# Patient Record
Sex: Male | Born: 1965 | Race: White | Hispanic: No | Marital: Married | Smoking: Former smoker
Health system: Southern US, Community
[De-identification: ages and names within clinical notes are randomized; demographics above are authoritative.]

## PROBLEM LIST (undated history)

## (undated) DIAGNOSIS — R51 Headache: Secondary | ICD-10-CM

## (undated) DIAGNOSIS — K219 Gastro-esophageal reflux disease without esophagitis: Secondary | ICD-10-CM

## (undated) DIAGNOSIS — E669 Obesity, unspecified: Secondary | ICD-10-CM

## (undated) DIAGNOSIS — I10 Essential (primary) hypertension: Secondary | ICD-10-CM

## (undated) DIAGNOSIS — E785 Hyperlipidemia, unspecified: Secondary | ICD-10-CM

## (undated) DIAGNOSIS — R519 Headache, unspecified: Secondary | ICD-10-CM

## (undated) DIAGNOSIS — G4730 Sleep apnea, unspecified: Secondary | ICD-10-CM

## (undated) HISTORY — DX: Hyperlipidemia, unspecified: E78.5

## (undated) HISTORY — DX: Essential (primary) hypertension: I10

## (undated) HISTORY — DX: Sleep apnea, unspecified: G47.30

## (undated) HISTORY — DX: Obesity, unspecified: E66.9

## (undated) HISTORY — DX: Gastro-esophageal reflux disease without esophagitis: K21.9

---
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
## 2014-01-14 LAB — LIPID PANEL
CHOLESTEROL, TOTAL: 197
HDL: 59 mg/dL (ref 35–70)
LDL (calc): 95
Triglycerides: 216

## 2014-09-05 ENCOUNTER — Ambulatory Visit (INDEPENDENT_AMBULATORY_CARE_PROVIDER_SITE_OTHER): Payer: BC Managed Care – PPO | Admitting: Family Medicine

## 2014-09-05 ENCOUNTER — Encounter: Payer: Self-pay | Admitting: Family Medicine

## 2014-09-05 VITALS — BP 118/82 | HR 84 | Temp 98.2°F | Ht 68.0 in | Wt 214.5 lb

## 2014-09-05 DIAGNOSIS — E785 Hyperlipidemia, unspecified: Secondary | ICD-10-CM

## 2014-09-05 DIAGNOSIS — M25562 Pain in left knee: Secondary | ICD-10-CM

## 2014-09-05 DIAGNOSIS — E66811 Obesity, class 1: Secondary | ICD-10-CM | POA: Insufficient documentation

## 2014-09-05 DIAGNOSIS — Z23 Encounter for immunization: Secondary | ICD-10-CM

## 2014-09-05 DIAGNOSIS — E669 Obesity, unspecified: Secondary | ICD-10-CM

## 2014-09-05 NOTE — Assessment & Plan Note (Signed)
Anticipate meniscal injury vs arthritis. Offered xray, ortho referral.  Pt will monitor for now and update me if sxs persist or fail to improve. Rec continued nsaid, protection with knee sleeve, elevation of leg, and rest. Known R knee injury.

## 2014-09-05 NOTE — Addendum Note (Signed)
Addended by: Josph MachoANCE, Tomie Elko A on: 09/05/2014 05:08 PM   Modules accepted: Orders

## 2014-09-05 NOTE — Assessment & Plan Note (Signed)
Body mass index is 32.62 kg/(m^2).

## 2014-09-05 NOTE — Assessment & Plan Note (Signed)
Will check FLP next fasting labwork and titrate statin accordingly.

## 2014-09-05 NOTE — Patient Instructions (Signed)
I think you either have arthritis or meniscal injury of left knee. Use brace when exercises for both knees, continue aleve as needed, elevate leg. Return at your convenience for fasting labs to check cholesterol. We will decide changes to cholesterol based on readings. Good to see you today, call us with questions. Flu shot today.

## 2014-09-05 NOTE — Progress Notes (Signed)
BP 118/82  Pulse 84  Temp(Src) 98.2 F (36.8 C) (Oral)  Ht 5\' 8"  (1.727 m)  Wt 214 lb 8 oz (97.297 kg)  BMI 32.62 kg/m2   CC: new pt to establish  Subjective:    Patient ID: Johnny Daniel, male    DOB: 02/08/1966, 48 y.o.   MRN: [id number]  HPI: Johnny Daniel is a 48 y.o. male presenting on 09/05/2014 for Establish Care and Knee Pain   Previously saw Dr. Dossie Arbour.  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  Runs 45 min daily regularly, not in last 2 weeks (see below).  HLD - on crestor 40mg  without myalgias. Crestor working well for cholesterol levels. Wonders about decreased muscle mass over last 6 months. Foot cramping improved with turmeric. No muscle weakness.   L knee pain - over last few weeks. Started after one of his routine runs. Stopped running for 2 wks, knee improving but still feels persistent pain at L knee. No known inciting injury/trauma. No locking. + instability. Treating with aleve which helps.  H/o R knee surgery as teen after meniscal injury during football. Chronic R knee pain since then. Told to stop running or would need knee replacement.  Wt Readings from Last 3 Encounters:  09/05/14 214 lb 8 oz (97.297 kg)  Body mass index is 32.62 kg/(m^2).  Preventative: Last CPE with labwork 6 mo ago.  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  Allergies and medications reviewed and updated. No current outpatient prescriptions on file prior to visit.   No current facility-administered medications on file prior to visit.    Review of Systems Per HPI unless specifically indicated above    Objective:    BP 118/82  Pulse 84  Temp(Src) 98.2 F (36.8 C) (Oral)  Ht 5\' 8"   (1.727 m)  Wt 214 lb 8 oz (97.297 kg)  BMI 32.62 kg/m2  Physical Exam  Nursing note and vitals reviewed. Constitutional: He is oriented to person, place, and time. He appears well-developed and well-nourished. No distress.  HENT:  Head: Normocephalic and atraumatic.  Right Ear: Hearing normal.  Left Ear: Hearing normal.  Nose: Nose normal.  Mouth/Throat: Uvula is midline, oropharynx is clear and moist and mucous membranes are normal. No oropharyngeal exudate, posterior oropharyngeal edema or posterior oropharyngeal erythema.  White PNdrainage  Eyes: Conjunctivae and EOM are normal. Pupils are equal, round, and reactive to light. No scleral icterus.  Neck: Normal range of motion. Neck supple.  Cardiovascular: Normal rate, regular rhythm, normal heart sounds and intact distal pulses.   No murmur heard. Pulses:      Radial pulses are 2+ on the right side, and 2+ on the left side.  Pulmonary/Chest: Effort normal and breath sounds normal. No respiratory distress. He has no wheezes. He has no rales.  Musculoskeletal: Normal range of motion. He exhibits no edema.  R knee: marked crepitus with flexion/extension but no pain to palpation. 2 scars present at site of previous surgery L Knee exam: No deformity on inspection. Tender to palpation medial posterior joint line  No effusion/swelling noted. FROM in flex/extension + crepitus. No popliteal fullness. Neg drawer test. Neg mcmurray test. Discomfort with valgus testing No PFgrind. No abnormal patellar mobility.  Lymphadenopathy:  He has no cervical adenopathy.  Neurological: He is alert and oriented to person, place, and time.  CN grossly intact, station and gait intact  Skin: Skin is warm and dry. No rash noted.  Psychiatric: He has a normal mood and affect. His behavior is normal. Judgment and thought content normal.   No results found for this or any previous visit.    Assessment & Plan:   Problem List Items Addressed This  Visit   Obesity     Body mass index is 32.62 kg/(m^2).     Left knee pain - Primary     Anticipate meniscal injury vs arthritis. Offered xray, ortho referral.  Pt will monitor for now and update me if sxs persist or fail to improve. Rec continued nsaid, protection with knee sleeve, elevation of leg, and rest. Known R knee injury.    HLD (hyperlipidemia)     Will check FLP next fasting labwork and titrate statin accordingly.    Relevant Medications      rosuvastatin (CRESTOR) 40 MG tablet   Other Relevant Orders      Lipid panel      Comprehensive metabolic panel       Follow up plan: Return in about 6 months (around 03/07/2015), or as needed, for annual exam, prior fasting for blood work.

## 2014-09-05 NOTE — Progress Notes (Signed)
Pre visit review using our clinic review tool, if applicable. No additional management support is needed unless otherwise documented below in the visit note. 

## 2014-09-11 ENCOUNTER — Other Ambulatory Visit: Payer: BC Managed Care – PPO

## 2014-09-24 ENCOUNTER — Other Ambulatory Visit (INDEPENDENT_AMBULATORY_CARE_PROVIDER_SITE_OTHER): Payer: BC Managed Care – PPO

## 2014-09-24 DIAGNOSIS — E785 Hyperlipidemia, unspecified: Secondary | ICD-10-CM

## 2014-09-24 LAB — COMPREHENSIVE METABOLIC PANEL
ALBUMIN: 3.8 g/dL (ref 3.5–5.2)
ALT: 30 U/L (ref 0–53)
AST: 22 U/L (ref 0–37)
Alkaline Phosphatase: 52 U/L (ref 39–117)
BUN: 15 mg/dL (ref 6–23)
CALCIUM: 9.5 mg/dL (ref 8.4–10.5)
CO2: 22 meq/L (ref 19–32)
Chloride: 102 mEq/L (ref 96–112)
Creatinine, Ser: 0.9 mg/dL (ref 0.4–1.5)
GFR: 91.88 mL/min (ref >60.00)
GLUCOSE: 109 mg/dL — AB (ref 70–99)
POTASSIUM: 4.5 meq/L (ref 3.5–5.1)
SODIUM: 137 meq/L (ref 135–145)
TOTAL PROTEIN: 8 g/dL (ref 6.0–8.3)
Total Bilirubin: 1.1 mg/dL (ref 0.2–1.2)

## 2014-09-24 LAB — LIPID PANEL
CHOLESTEROL: 267 mg/dL — AB (ref 0–200)
HDL: 55.2 mg/dL (ref >39.00)
LDL CALC: 176 mg/dL — AB (ref 0–99)
NonHDL: 211.8
TRIGLYCERIDES: 178 mg/dL — AB (ref 0.0–149.0)
Total CHOL/HDL Ratio: 5
VLDL: 35.6 mg/dL (ref 0.0–40.0)

## 2014-09-30 ENCOUNTER — Other Ambulatory Visit: Payer: Self-pay | Admitting: *Deleted

## 2014-09-30 ENCOUNTER — Encounter: Payer: Self-pay | Admitting: *Deleted

## 2014-09-30 MED ORDER — ROSUVASTATIN CALCIUM 40 MG PO TABS: 40.0000 mg | ORAL_TABLET | Freq: Every day | ORAL | Status: DC

## 2015-03-07 ENCOUNTER — Other Ambulatory Visit (INDEPENDENT_AMBULATORY_CARE_PROVIDER_SITE_OTHER): Payer: BC Managed Care – PPO

## 2015-03-07 ENCOUNTER — Other Ambulatory Visit: Payer: Self-pay | Admitting: Family Medicine

## 2015-03-07 DIAGNOSIS — E785 Hyperlipidemia, unspecified: Secondary | ICD-10-CM

## 2015-03-07 LAB — LIPID PANEL
CHOLESTEROL: 168 mg/dL (ref 0–200)
HDL: 53.1 mg/dL (ref >39.00)
LDL CALC: 100 mg/dL — AB (ref 0–99)
NonHDL: 114.9
Total CHOL/HDL Ratio: 3
Triglycerides: 75 mg/dL (ref 0.0–149.0)
VLDL: 15 mg/dL (ref 0.0–40.0)

## 2015-03-07 LAB — TSH: TSH: 1.95 u[IU]/mL (ref 0.35–4.50)

## 2015-03-07 LAB — COMPREHENSIVE METABOLIC PANEL
ALT: 29 U/L (ref 0–53)
AST: 23 U/L (ref 0–37)
Albumin: 4.1 g/dL (ref 3.5–5.2)
Alkaline Phosphatase: 50 U/L (ref 39–117)
BUN: 18 mg/dL (ref 6–23)
CO2: 28 mEq/L (ref 19–32)
CREATININE: 0.92 mg/dL (ref 0.40–1.50)
Calcium: 9.5 mg/dL (ref 8.4–10.5)
Chloride: 104 mEq/L (ref 96–112)
GFR: 92.86 mL/min (ref >60.00)
Glucose, Bld: 114 mg/dL — ABNORMAL HIGH (ref 70–99)
POTASSIUM: 4.4 meq/L (ref 3.5–5.1)
Sodium: 138 mEq/L (ref 135–145)
Total Bilirubin: 1.1 mg/dL (ref 0.2–1.2)
Total Protein: 7 g/dL (ref 6.0–8.3)

## 2015-03-14 ENCOUNTER — Ambulatory Visit (INDEPENDENT_AMBULATORY_CARE_PROVIDER_SITE_OTHER): Payer: BC Managed Care – PPO | Admitting: Family Medicine

## 2015-03-14 ENCOUNTER — Encounter: Payer: Self-pay | Admitting: Family Medicine

## 2015-03-14 VITALS — BP 108/62 | HR 61 | Temp 98.1°F | Ht 67.25 in | Wt 218.8 lb

## 2015-03-14 DIAGNOSIS — R739 Hyperglycemia, unspecified: Secondary | ICD-10-CM

## 2015-03-14 DIAGNOSIS — E669 Obesity, unspecified: Secondary | ICD-10-CM

## 2015-03-14 DIAGNOSIS — Z0000 Encounter for general adult medical examination without abnormal findings: Secondary | ICD-10-CM | POA: Insufficient documentation

## 2015-03-14 DIAGNOSIS — R7303 Prediabetes: Secondary | ICD-10-CM | POA: Insufficient documentation

## 2015-03-14 DIAGNOSIS — E785 Hyperlipidemia, unspecified: Secondary | ICD-10-CM

## 2015-03-14 DIAGNOSIS — M25562 Pain in left knee: Secondary | ICD-10-CM

## 2015-03-14 DIAGNOSIS — G479 Sleep disorder, unspecified: Secondary | ICD-10-CM

## 2015-03-14 DIAGNOSIS — G4733 Obstructive sleep apnea (adult) (pediatric): Secondary | ICD-10-CM | POA: Insufficient documentation

## 2015-03-14 MED ORDER — LORCASERIN HCL 10 MG PO TABS: 10.0000 mg | ORAL_TABLET | Freq: Two times a day (BID) | ORAL | Status: DC

## 2015-03-14 NOTE — Progress Notes (Signed)
Pre visit review using our clinic review tool, if applicable. No additional management support is needed unless otherwise documented below in the visit note. 

## 2015-03-14 NOTE — Assessment & Plan Note (Signed)
Discussed elevated sugar with patient, encouraged weight loss and staying active and avoiding added sugars.

## 2015-03-14 NOTE — Assessment & Plan Note (Addendum)
Endorses longstanding trouble sleeping, wife endorses snoring and apneic episodes, endorses daytime somnolence, concerned that fit bit shows very small amt restful and deep sleep ESS today = 18-21 Will refer to pulm clinic to consider sleep study.

## 2015-03-14 NOTE — Assessment & Plan Note (Signed)
Discussed healthy diet and lifestyle choices to affect sustainable weight loss. Body mass index is 34.01 kg/(m^2).  Discussed bariatric medications including belviq and phentermine including common side effects and mechanisms of action. Pt would like to trial belviq - 15d Rx and 30d Rx printed for patient as well as coupon box we have available. RTC 2 mo f/u weight.

## 2015-03-14 NOTE — Assessment & Plan Note (Signed)
Encouraged he schedule f/u with ortho as knee still bothering him. 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

## 2015-03-14 NOTE — Progress Notes (Addendum)
BP 108/62 mmHg  Pulse 61  Temp(Src) 98.1 F (36.7 C) (Oral)  Ht 5' 7.25" (1.708 m)  Wt 218 lb 12 oz (99.224 kg)  BMI 34.01 kg/m2  SpO2 94%   CC: CPE  Subjective:    Patient ID: Johnny Daniel, male    DOB: 04/28/1966, 49 y.o.   MRN: [id number]  HPI: Johnny Daniel is a 49 y.o. male presenting on 03/14/2015 for Annual Exam and Knee Pain   See prior note for L knee pain - anticipated meniscal injury vs arthritis, pt declined xray or ortho referral. He actually has seen ortho [facility] and had normal L knee xrays per pt report. Diagnosed with PFPS, advised use band and NSIAD which has helped but pain persists.  Advised return if not improving to discuss MRI. May return to see ortho.   L knee pain - over last several months that comes on at end of run. Started after one of his routine runs. Stopped running for 2 wks, knee improving but still feels persistent pain at L knee. No known inciting injury/trauma. No locking. + instability. Treating with aleve which helps  H/o R knee surgery as teen after meniscal injury during football, also with marked arthritis. Chronic R knee pain since then. Told to stop running or would need knee replacement.  Snoring - nasal saline and mouth guard have helped. Possible apnea reported by wife but improved with above. Vivo fit 2 telling him he only gets 20-30 min deep sleep per night. Noticing more trouble sleeping.  Obesity - interested in medication for this.  Preventative: Flu 08/2014 Td 2010 Seat belt use discussed sunscreen use discussed. Skin care discussed.  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 Interim medical history since our last visit  reviewed. Allergies and medications reviewed and updated. Current Outpatient Prescriptions on File Prior to Visit  Medication Sig  . rosuvastatin (CRESTOR) 40 MG tablet Take 1 tablet (40 mg total) by mouth daily.   No current facility-administered medications on file prior to visit.    Review of Systems  Constitutional: Negative for fever, chills, activity change, appetite change, fatigue and unexpected weight change.  HENT: Negative for hearing loss.   Eyes: Negative for visual disturbance.  Respiratory: Negative for cough, chest tightness, shortness of breath and wheezing.   Cardiovascular: Negative for chest pain, palpitations and leg swelling.  Gastrointestinal: Negative for nausea, vomiting, abdominal pain, diarrhea, constipation, blood in stool and abdominal distention.  Genitourinary: Negative for hematuria and difficulty urinating.  Musculoskeletal: Negative for myalgias, arthralgias and neck pain.  Skin: Negative for rash.  Neurological: Negative for dizziness, seizures, syncope and headaches.  Hematological: Negative for adenopathy. Does not bruise/bleed easily.  Psychiatric/Behavioral: Negative for dysphoric mood. The patient is not nervous/anxious.    Per HPI unless specifically indicated above     Objective:    BP 108/62 mmHg  Pulse 61  Temp(Src) 98.1 F (36.7 C) (Oral)  Ht 5' 7.25" (1.708 m)  Wt 218 lb 12 oz (99.224 kg)  BMI 34.01 kg/m2  SpO2 94%  Wt Readings from Last 3 Encounters:  03/14/15 218 lb 12 oz (99.224 kg)  09/05/14 214 lb 8 oz (97.297 kg)    Physical Exam  Constitutional: He is oriented to person, place,  and time. He appears well-developed and well-nourished. No distress.  HENT:  Head: Normocephalic and atraumatic.  Right Ear: Hearing, tympanic membrane, external ear and ear canal normal.  Left Ear: Hearing, tympanic membrane, external ear and ear canal normal.  Nose: Nose normal.  Mouth/Throat: Uvula is midline, oropharynx is clear and moist  and mucous membranes are normal. No oropharyngeal exudate, posterior oropharyngeal edema or posterior oropharyngeal erythema.  Eyes: Conjunctivae and EOM are normal. Pupils are equal, round, and reactive to light. No scleral icterus.  Neck: Normal range of motion. Neck supple. No thyromegaly present.  Cardiovascular: Normal rate, regular rhythm, normal heart sounds and intact distal pulses.   No murmur heard. Pulses:      Radial pulses are 2+ on the right side, and 2+ on the left side.  Pulmonary/Chest: Effort normal and breath sounds normal. No respiratory distress. He has no wheezes. He has no rales.  Abdominal: Soft. Bowel sounds are normal. He exhibits no distension and no mass. There is no tenderness. There is no rebound and no guarding.  Musculoskeletal: Normal range of motion. He exhibits no edema.  Lymphadenopathy:    He has no cervical adenopathy.  Neurological: He is alert and oriented to person, place, and time.  CN grossly intact, station and gait intact  Skin: Skin is warm and dry. No rash noted.  Psychiatric: He has a normal mood and affect. His behavior is normal. Judgment and thought content normal.  Nursing note and vitals reviewed.  Results for orders placed or performed in visit on 03/07/15  TSH  Result Value Ref Range   TSH 1.95 0.35 - 4.50 uIU/mL  Lipid panel  Result Value Ref Range   Cholesterol 168 0 - 200 mg/dL   Triglycerides 40.9 0.0 - 149.0 mg/dL   HDL 81.19 >14.78 mg/dL   VLDL 29.5 0.0 - 62.1 mg/dL   LDL Cholesterol 308 (H) 0 - 99 mg/dL   Total CHOL/HDL Ratio 3    NonHDL 114.90   Comprehensive metabolic panel  Result Value Ref Range   Sodium 138 135 - 145 mEq/L   Potassium 4.4 3.5 - 5.1 mEq/L   Chloride 104 96 - 112 mEq/L   CO2 28 19 - 32 mEq/L   Glucose, Bld 114 (H) 70 - 99 mg/dL   BUN 18 6 - 23 mg/dL   Creatinine, Ser 6.57 0.40 - 1.50 mg/dL   Total Bilirubin 1.1 0.2 - 1.2 mg/dL   Alkaline Phosphatase 50 39 - 117 U/L   AST 23 0 - 37 U/L   ALT 29  0 - 53 U/L   Total Protein 7.0 6.0 - 8.3 g/dL   Albumin 4.1 3.5 - 5.2 g/dL   Calcium 9.5 8.4 - 84.6 mg/dL   GFR 96.29 >52.84 mL/min      Assessment & Plan:   Problem List Items Addressed This Visit    Sleeping difficulty    Endorses longstanding trouble sleeping, wife endorses snoring and apneic episodes, endorses daytime somnolence, concerned that fit bit shows very small amt restful and deep sleep ESS today = 18-21 Will refer to pulm clinic to consider sleep study.      Relevant Orders   Ambulatory referral to Pulmonology   Obesity    Discussed healthy diet and lifestyle choices to affect sustainable weight loss. Body mass index is 34.01 kg/(m^2).  Discussed bariatric medications including belviq and phentermine including common side effects and mechanisms of action. Pt would like to trial belviq - 15d Rx and 30d  Rx printed for patient as well as coupon box we have available. RTC 2 mo f/u weight.      Relevant Medications   Lorcaserin HCl (BELVIQ) 10 MG TABS   Lorcaserin HCl (BELVIQ) 10 MG TABS   Other Relevant Orders   Ambulatory referral to Pulmonology   Left knee pain    Encouraged he schedule f/u with ortho as knee still bothering him. 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      Hyperglycemia    Discussed elevated sugar with patient, encouraged weight loss and staying active and avoiding added sugars.      HLD (hyperlipidemia)    Chronic, great control on current regimen. No changes indicated. fmhx CAD.      Relevant Medications   aspirin 81 MG EC tablet   Health maintenance examination - Primary    Preventative protocols reviewed and updated unless pt declined. Discussed healthy diet and lifestyle.           Follow up plan: Return in about 2 months (around 05/14/2015), or as needed, for follow up visit.

## 2015-03-14 NOTE — Patient Instructions (Addendum)
Check back in with ortho about left knee. Watch added sugars. Trial belviq - coupon provided today.  We will refer you to sleep doctor for evaluation.

## 2015-03-14 NOTE — Assessment & Plan Note (Signed)
Chronic, great control on current regimen. No changes indicated. fmhx CAD.

## 2015-03-14 NOTE — Assessment & Plan Note (Signed)
Preventative protocols reviewed and updated unless pt declined. Discussed healthy diet and lifestyle.  

## 2015-03-20 ENCOUNTER — Telehealth: Payer: Self-pay | Admitting: *Deleted

## 2015-03-20 NOTE — Telephone Encounter (Signed)
PA for Belviq in your IN box for completion.

## 2015-03-20 NOTE — Telephone Encounter (Signed)
signed and in Kim's box. 

## 2015-03-20 NOTE — Telephone Encounter (Signed)
PA faxed. Will await determination. 

## 2015-03-26 ENCOUNTER — Other Ambulatory Visit: Payer: Self-pay | Admitting: *Deleted

## 2015-03-26 ENCOUNTER — Telehealth: Payer: Self-pay | Admitting: Family Medicine

## 2015-03-26 MED ORDER — ROSUVASTATIN CALCIUM 40 MG PO TABS: 40.0000 mg | ORAL_TABLET | Freq: Every day | ORAL | Status: DC

## 2015-03-26 NOTE — Telephone Encounter (Signed)
-----   Message from [place] Kol[place]rat sent at 03/26/2015  1:01 PM EDT ----- Regarding: Needs New RX for Crestor Dr Reece AgarG, Patient needs New RX for Crestor sent to his pharmacy at [facility] [address] [place]. He will need a New one for a year there are no refills left. Thanks people! Shirlee LimerickMarion

## 2015-03-27 NOTE — Telephone Encounter (Signed)
Additional info required. In your IN box for completion. 

## 2015-03-27 NOTE — Telephone Encounter (Signed)
Filled and in Kim's box. May need to try phentermine first.

## 2015-03-28 NOTE — Telephone Encounter (Signed)
Form faxed

## 2015-04-09 ENCOUNTER — Other Ambulatory Visit: Payer: Self-pay | Admitting: Specialist

## 2015-04-09 DIAGNOSIS — S83232D Complex tear of medial meniscus, current injury, left knee, subsequent encounter: Secondary | ICD-10-CM

## 2015-04-16 ENCOUNTER — Ambulatory Visit
Admission: RE | Admit: 2015-04-16 | Discharge: 2015-04-16 | Disposition: A | Discharge status: Home / Self Care | Payer: BC Managed Care – PPO | Source: Ambulatory Visit | Attending: Specialist | Admitting: Specialist

## 2015-04-16 DIAGNOSIS — S83232D Complex tear of medial meniscus, current injury, left knee, subsequent encounter: Secondary | ICD-10-CM

## 2015-04-16 DIAGNOSIS — M7122 Synovial cyst of popliteal space [Baker], left knee: Secondary | ICD-10-CM | POA: Insufficient documentation

## 2015-04-16 DIAGNOSIS — X58XXXA Exposure to other specified factors, initial encounter: Secondary | ICD-10-CM | POA: Diagnosis not present

## 2015-04-16 DIAGNOSIS — S83232A Complex tear of medial meniscus, current injury, left knee, initial encounter: Secondary | ICD-10-CM | POA: Diagnosis present

## 2015-05-12 MED ORDER — CEFAZOLIN SODIUM 1-5 GM-% IV SOLN
INTRAVENOUS | Status: AC
  Filled 2015-05-12: qty 50

## 2015-05-13 ENCOUNTER — Encounter: Payer: Self-pay | Admitting: *Deleted

## 2015-05-13 ENCOUNTER — Other Ambulatory Visit: Payer: BC Managed Care – PPO

## 2015-05-13 NOTE — Patient Instructions (Signed)
  Your procedure is scheduled on:05-20-15 Report to [facility] 2ND FLOOR To find out your arrival time please call [phone] between 1PM - 3PM on 05-19-15   Remember: Instructions that are not followed completely may result in serious medical risk, up to and including death, or upon the discretion of your surgeon and anesthesiologist your surgery may need to be rescheduled.    _X___ 1. Do not eat food or drink liquids after midnight. No gum chewing or hard candies.     _X___ 2. No Alcohol for 24 hours before or after surgery.   ____ 3. Bring all medications with you on the day of surgery if instructed.    ____ 4. Notify your doctor if there is any change in your medical condition     (cold, fever, infections).     Do not wear jewelry, make-up, hairpins, clips or nail polish.  Do not wear lotions, powders, or perfumes. You may wear deodorant.  Do not shave 48 hours prior to surgery. Men may shave face and neck.  Do not bring valuables to the hospital.    [facility] is not responsible for any belongings or valuables.               Contacts, dentures or bridgework may not be worn into surgery.  Leave your suitcase in the car. After surgery it may be brought to your room.  For patients admitted to the hospital, discharge time is determined by your treatment team.   Patients discharged the day of surgery will not be allowed to drive home.   Please read over the following fact sheets that you were given:      ____ Take these medicines the morning of surgery with A SIP OF WATER:    1. NONE  2.   3.   4.  5.  6.  ____ Fleet Enema (as directed)   ____ Use CHG Soap as directed  ____ Use inhalers on the day of surgery  ____ Stop metformin 2 days prior to surgery    ____ Take 1/2 of usual insulin dose the night before surgery and none on the morning of surgery.   _X___ Stop Coumadin/Plavix/aspirin-STOP ASPIRIN NOW  ____ Stop Anti-inflammatories-NO NSAIDS  OR ASA PRODUCTS-TYLENOL OK   _X___ Stop supplements until after surgery-STOP MELATONIN AND TURMERIC NOW   ____ Bring C-Pap to the hospital.

## 2015-05-14 ENCOUNTER — Encounter: Payer: Self-pay | Admitting: Family Medicine

## 2015-05-14 ENCOUNTER — Ambulatory Visit (INDEPENDENT_AMBULATORY_CARE_PROVIDER_SITE_OTHER): Payer: BC Managed Care – PPO | Admitting: Family Medicine

## 2015-05-14 VITALS — BP 124/82 | HR 60 | Temp 98.1°F | Wt 222.0 lb

## 2015-05-14 DIAGNOSIS — M25562 Pain in left knee: Secondary | ICD-10-CM

## 2015-05-14 DIAGNOSIS — G479 Sleep disorder, unspecified: Secondary | ICD-10-CM

## 2015-05-14 DIAGNOSIS — E669 Obesity, unspecified: Secondary | ICD-10-CM

## 2015-05-14 DIAGNOSIS — E66811 Obesity, class 1: Secondary | ICD-10-CM

## 2015-05-14 MED ORDER — PHENTERMINE HCL 30 MG PO CAPS: 30.0000 mg | ORAL_CAPSULE | ORAL | Status: DC

## 2015-05-14 NOTE — Patient Instructions (Addendum)
Hold on new medicines until after surgery. When you feel up to it start phentermine 30mg  daily for appetite suppression. Stop med immediately if any chest pain.  Return in 27mo for f/u visit.

## 2015-05-14 NOTE — Assessment & Plan Note (Addendum)
Pending arthroscopy next week for complex meniscal tear left knee.

## 2015-05-14 NOTE — Progress Notes (Signed)
Pre visit review using our clinic review tool, if applicable. No additional management support is needed unless otherwise documented below in the visit note. 

## 2015-05-14 NOTE — Assessment & Plan Note (Signed)
Has pending appt with pulm but may need to reschedule 2/2 upcoming knee surgery.

## 2015-05-14 NOTE — Assessment & Plan Note (Signed)
Exercise limited by knee pain pending arthroscopy. belviq not covered by insurance. Will trial phentermine. Discussed mechanism of action and common side effects to monitor. No personal hx CAD. Does have some fmhx CAD - advised if any chest pain to stop immediately and let me know. RTC 2-3 mo f/u visit.

## 2015-05-14 NOTE — Progress Notes (Signed)
BP 124/82 mmHg  Pulse 60  Temp(Src) 98.1 F (36.7 C) (Oral)  Wt 222 lb (100.699 kg)   CC: 2 mo f/u visit obesity Subjective:    Patient ID: Johnny Daniel, male    DOB: 09/03/1966, 50 y.o.   MRN: [id number]  HPI: Johnny Daniel is a 49 y.o. male presenting on 05/14/2015 for Follow-up  Pending L knee arthroscopy next week. Meniscal tear.   Last visit we referred patient to sleep doctor to eval for sleep apnea. Has not seen pulm yet. Appt already scheduled.   Obesity - last visit we started belviq. This was not covered by insurance. Needed to try phentermine first. Unfortunately he has had 4 lb weight gain in last few months.   Pt does have fmhx CAD - father with MI age 50s, heavy smoker and morbid obesity.  Relevant past medical, surgical, family and social history reviewed and updated as indicated. Interim medical history since our last visit reviewed. Allergies and medications reviewed and updated. Current Outpatient Prescriptions on File Prior to Visit  Medication Sig  . aspirin 81 MG EC tablet Take 81 mg by mouth daily. Swallow whole.  . Glucosamine 500 MG CAPS Take 1 capsule by mouth daily.  . Melatonin 5 MG TABS Take 1 tablet by mouth at bedtime.  . rosuvastatin (CRESTOR) 40 MG tablet Take 1 tablet (40 mg total) by mouth daily.  . Turmeric 500 MG CAPS Take 1 capsule by mouth daily.   No current facility-administered medications on file prior to visit.    Review of Systems Per HPI unless specifically indicated above     Objective:    BP 124/82 mmHg  Pulse 60  Temp(Src) 98.1 F (36.7 C) (Oral)  Wt 222 lb (100.699 kg)  Wt Readings from Last 3 Encounters:  05/14/15 222 lb (100.699 kg)  05/13/15 215 lb (97.523 kg)  03/14/15 218 lb 12 oz (99.224 kg)   Body mass index is 33.76 kg/(m^2). Physical Exam  Constitutional: He appears well-developed and well-nourished. No distress.  HENT:  Mouth/Throat: Oropharynx is clear and moist. No oropharyngeal exudate.    Cardiovascular: Normal rate, regular rhythm, normal heart sounds and intact distal pulses.   No murmur heard. Pulmonary/Chest: Effort normal and breath sounds normal. No respiratory distress. He has no wheezes. He has no rales.  Musculoskeletal: He exhibits no edema.  Skin: Skin is warm and dry. No rash noted.  Nursing note and vitals reviewed.  Results for orders placed or performed in visit on 03/07/15  TSH  Result Value Ref Range   TSH 1.95 0.35 - 4.50 uIU/mL  Lipid panel  Result Value Ref Range   Cholesterol 168 0 - 200 mg/dL   Triglycerides 19.1 0.0 - 149.0 mg/dL   HDL 47.82 >95.62 mg/dL   VLDL 13.0 0.0 - 86.5 mg/dL   LDL Cholesterol 784 (H) 0 - 99 mg/dL   Total CHOL/HDL Ratio 3    NonHDL 114.90   Comprehensive metabolic panel  Result Value Ref Range   Sodium 138 135 - 145 mEq/L   Potassium 4.4 3.5 - 5.1 mEq/L   Chloride 104 96 - 112 mEq/L   CO2 28 19 - 32 mEq/L   Glucose, Bld 114 (H) 70 - 99 mg/dL   BUN 18 6 - 23 mg/dL   Creatinine, Ser 6.96 0.40 - 1.50 mg/dL   Total Bilirubin 1.1 0.2 - 1.2 mg/dL   Alkaline Phosphatase 50 39 - 117 U/L   AST 23 0 - 37 U/L  ALT 29 0 - 53 U/L   Total Protein 7.0 6.0 - 8.3 g/dL   Albumin 4.1 3.5 - 5.2 g/dL   Calcium 9.5 8.4 - 14.4 mg/dL   GFR 81.85 >63.14 mL/min      Assessment & Plan:   Problem List Items Addressed This Visit    Left knee pain    Pending arthroscopy next week for complex meniscal tear left knee.      Obesity, Class I, BMI 30-34.9 - Primary    Exercise limited by knee pain pending arthroscopy. belviq not covered by insurance. Will trial phentermine. Discussed mechanism of action and common side effects to monitor. No personal hx CAD. Does have some fmhx CAD - advised if any chest pain to stop immediately and let me know. RTC 2-3 mo f/u visit.      Relevant Medications   phentermine 30 MG capsule   Sleeping difficulty    Has pending appt with pulm but may need to reschedule 2/2 upcoming knee surgery.           Follow up plan: Return in about 3 months (around 08/14/2015), or as needed, for follow up visit.

## 2015-05-20 ENCOUNTER — Ambulatory Visit: Payer: BC Managed Care – PPO | Admitting: Anesthesiology

## 2015-05-20 ENCOUNTER — Encounter
Admission: RE | Disposition: A | Discharge status: Home / Self Care | Payer: Self-pay | Source: Ambulatory Visit | Attending: Specialist

## 2015-05-20 ENCOUNTER — Ambulatory Visit
Admission: RE | Admit: 2015-05-20 | Discharge: 2015-05-20 | Disposition: A | Discharge status: Home / Self Care | Payer: BC Managed Care – PPO | Source: Ambulatory Visit | Attending: Specialist | Admitting: Specialist

## 2015-05-20 ENCOUNTER — Encounter: Payer: Self-pay | Admitting: *Deleted

## 2015-05-20 DIAGNOSIS — G4730 Sleep apnea, unspecified: Secondary | ICD-10-CM | POA: Insufficient documentation

## 2015-05-20 DIAGNOSIS — M25562 Pain in left knee: Secondary | ICD-10-CM | POA: Diagnosis present

## 2015-05-20 DIAGNOSIS — Z823 Family history of stroke: Secondary | ICD-10-CM | POA: Insufficient documentation

## 2015-05-20 DIAGNOSIS — Z791 Long term (current) use of non-steroidal anti-inflammatories (NSAID): Secondary | ICD-10-CM | POA: Diagnosis not present

## 2015-05-20 DIAGNOSIS — I739 Peripheral vascular disease, unspecified: Secondary | ICD-10-CM | POA: Diagnosis not present

## 2015-05-20 DIAGNOSIS — Z79899 Other long term (current) drug therapy: Secondary | ICD-10-CM | POA: Insufficient documentation

## 2015-05-20 DIAGNOSIS — Z87891 Personal history of nicotine dependence: Secondary | ICD-10-CM | POA: Diagnosis not present

## 2015-05-20 DIAGNOSIS — Z8261 Family history of arthritis: Secondary | ICD-10-CM | POA: Insufficient documentation

## 2015-05-20 DIAGNOSIS — Z809 Family history of malignant neoplasm, unspecified: Secondary | ICD-10-CM | POA: Diagnosis not present

## 2015-05-20 DIAGNOSIS — Z9889 Other specified postprocedural states: Secondary | ICD-10-CM | POA: Diagnosis not present

## 2015-05-20 DIAGNOSIS — E780 Pure hypercholesterolemia: Secondary | ICD-10-CM | POA: Insufficient documentation

## 2015-05-20 DIAGNOSIS — S83242A Other tear of medial meniscus, current injury, left knee, initial encounter: Secondary | ICD-10-CM | POA: Diagnosis not present

## 2015-05-20 DIAGNOSIS — X58XXXA Exposure to other specified factors, initial encounter: Secondary | ICD-10-CM | POA: Diagnosis not present

## 2015-05-20 HISTORY — DX: Headache, unspecified: R51.9

## 2015-05-20 HISTORY — PX: KNEE ARTHROSCOPY WITH MEDIAL MENISECTOMY: SHX5651

## 2015-05-20 HISTORY — DX: Headache: R51

## 2015-05-20 SURGERY — ARTHROSCOPY, KNEE, WITH MEDIAL MENISCECTOMY
Anesthesia: General | Laterality: Left

## 2015-05-20 MED ORDER — FAMOTIDINE 20 MG PO TABS
ORAL_TABLET | ORAL | Status: AC
  Filled 2015-05-20: qty 1

## 2015-05-20 MED ORDER — FENTANYL CITRATE (PF) 100 MCG/2ML IJ SOLN
INTRAMUSCULAR | Status: AC
  Administered 2015-05-20: 25 ug via INTRAVENOUS
  Filled 2015-05-20: qty 2

## 2015-05-20 MED ORDER — BUPIVACAINE-EPINEPHRINE (PF) 0.5% -1:200000 IJ SOLN
INTRAMUSCULAR | Status: DC | PRN
  Administered 2015-05-20: 20 mL
  Administered 2015-05-20: 10 mL

## 2015-05-20 MED ORDER — LACTATED RINGERS IV SOLN
Freq: Once | INTRAVENOUS | Status: AC
  Administered 2015-05-20: 07:00:00 via INTRAVENOUS

## 2015-05-20 MED ORDER — FENTANYL CITRATE (PF) 100 MCG/2ML IJ SOLN
INTRAMUSCULAR | Status: DC | PRN
  Administered 2015-05-20: 100 ug via INTRAVENOUS

## 2015-05-20 MED ORDER — ONDANSETRON HCL 4 MG/2ML IJ SOLN
INTRAMUSCULAR | Status: DC | PRN
Start: 2015-05-20 — End: 2015-05-20
  Administered 2015-05-20: 4 mg via INTRAVENOUS

## 2015-05-20 MED ORDER — FENTANYL CITRATE (PF) 100 MCG/2ML IJ SOLN
25.0000 ug | INTRAMUSCULAR | Status: DC | PRN
  Administered 2015-05-20 (×4): 25 ug via INTRAVENOUS

## 2015-05-20 MED ORDER — MORPHINE SULFATE 4 MG/ML IJ SOLN
INTRAMUSCULAR | Status: DC | PRN
  Administered 2015-05-20: 4 mg via SUBCUTANEOUS

## 2015-05-20 MED ORDER — FAMOTIDINE 20 MG PO TABS: 20.0000 mg | ORAL_TABLET | Freq: Once | ORAL | Status: DC

## 2015-05-20 MED ORDER — KETOROLAC TROMETHAMINE 30 MG/ML IJ SOLN
INTRAMUSCULAR | Status: DC | PRN
  Administered 2015-05-20: 30 mg via INTRAVENOUS

## 2015-05-20 MED ORDER — PROPOFOL 10 MG/ML IV BOLUS
INTRAVENOUS | Status: DC | PRN
  Administered 2015-05-20: 200 mg via INTRAVENOUS

## 2015-05-20 MED ORDER — MORPHINE SULFATE 4 MG/ML IJ SOLN
INTRAMUSCULAR | Status: AC
  Filled 2015-05-20: qty 1

## 2015-05-20 MED ORDER — LIDOCAINE HCL (CARDIAC) 20 MG/ML IV SOLN
INTRAVENOUS | Status: DC | PRN
  Administered 2015-05-20: 100 mg via INTRAVENOUS

## 2015-05-20 MED ORDER — MIDAZOLAM HCL 2 MG/2ML IJ SOLN
INTRAMUSCULAR | Status: DC | PRN
  Administered 2015-05-20: 2 mg via INTRAVENOUS

## 2015-05-20 MED ORDER — BUPIVACAINE-EPINEPHRINE (PF) 0.5% -1:200000 IJ SOLN
INTRAMUSCULAR | Status: AC
  Filled 2015-05-20: qty 30

## 2015-05-20 MED ORDER — ONDANSETRON HCL 4 MG/2ML IJ SOLN: 4.0000 mg | Freq: Once | INTRAMUSCULAR | Status: DC | PRN

## 2015-05-20 MED ORDER — HYDROCODONE-ACETAMINOPHEN 5-325 MG PO TABS: 1.0000 | ORAL_TABLET | Freq: Four times a day (QID) | ORAL | Status: DC | PRN

## 2015-05-20 MED ORDER — PHENYLEPHRINE HCL 10 MG/ML IJ SOLN
INTRAMUSCULAR | Status: DC | PRN
  Administered 2015-05-20: 100 ug via INTRAVENOUS

## 2015-05-20 SURGICAL SUPPLY — 19 items
BANDAGE ELASTIC 6 CLIP NS LF (GAUZE/BANDAGES/DRESSINGS) ×3 IMPLANT
BLADE AGGRESSIVE PLUS 4.0 (BLADE) ×1 IMPLANT
BUR RADIUS 3.5 (BURR) ×3 IMPLANT
CHLORAPREP W/TINT 26ML (MISCELLANEOUS) ×3 IMPLANT
DECANTER SPIKE VIAL GLASS SM (MISCELLANEOUS) ×1 IMPLANT
GAUZE SPONGE 4X4 12PLY STRL (GAUZE/BANDAGES/DRESSINGS) ×3 IMPLANT
GLOVE BIO SURGEON STRL SZ7.5 (GLOVE) ×11 IMPLANT
GOWN STRL REUS W/ TWL LRG LVL3 (GOWN DISPOSABLE) ×2 IMPLANT
GOWN STRL REUS W/TWL LRG LVL3 (GOWN DISPOSABLE) ×9
IV LACTATED RINGER IRRG 3000ML (IV SOLUTION) ×12
IV LR IRRIG 3000ML ARTHROMATIC (IV SOLUTION) ×6 IMPLANT
MANIFOLD NEPTUNE II (INSTRUMENTS) ×3 IMPLANT
PACK ARTHROSCOPY KNEE (MISCELLANEOUS) ×3 IMPLANT
SET TUBE SUCT SHAVER OUTFL 24K (TUBING) ×3 IMPLANT
SET TUBE TIP INTRA-ARTICULAR (MISCELLANEOUS) ×3 IMPLANT
STRAP SAFETY BODY (MISCELLANEOUS) ×3 IMPLANT
SUT ETHILON 5-0 FS-2 18 BLK (SUTURE) ×3 IMPLANT
TUBING ARTHRO INFLOW-ONLY STRL (TUBING) ×3 IMPLANT
WAND HAND CNTRL MULTIVAC 50 (MISCELLANEOUS) ×3 IMPLANT

## 2015-05-20 NOTE — Anesthesia Preprocedure Evaluation (Signed)
Anesthesia Evaluation  Patient identified by MRN, date of birth, ID band Patient awake    Reviewed: Allergy & Precautions, NPO status , Patient's Chart, lab work & pertinent test results  Airway Mallampati: II  TM Distance: >3 FB Neck ROM: Full    Dental  (+) Teeth Intact   Pulmonary former smoker (quit x 25 yrs),          Cardiovascular + Peripheral Vascular Disease     Neuro/Psych    GI/Hepatic   Endo/Other    Renal/GU      Musculoskeletal   Abdominal   Peds  Hematology   Anesthesia Other Findings   Reproductive/Obstetrics                             Anesthesia Physical Anesthesia Plan  ASA: II  Anesthesia Plan: General   Post-op Pain Management:    Induction: Intravenous  Airway Management Planned: Oral ETT  Additional Equipment:   Intra-op Plan:   Post-operative Plan:   Informed Consent: I have reviewed the patients History and Physical, chart, labs and discussed the procedure including the risks, benefits and alternatives for the proposed anesthesia with the patient or authorized representative who has indicated his/her understanding and acceptance.     Plan Discussed with:   Anesthesia Plan Comments:         Anesthesia Quick Evaluation

## 2015-05-20 NOTE — Anesthesia Postprocedure Evaluation (Signed)
  Anesthesia Post-op Note  Patient: Johnny Daniel  Procedure(s) Performed: Procedure(s): Left knee arthroscopy with partial medial meniscectomy  (Left)  Anesthesia type:General  Patient location: PACU  Post pain: Pain level controlled  Post assessment: Post-op Vital signs reviewed, Patient's Cardiovascular Status Stable, Respiratory Function Stable, Patent Airway and No signs of Nausea or vomiting  Post vital signs: Reviewed and stable  Last Vitals:  Filed Vitals:   05/20/15 0909  BP: 116/85  Pulse: 52  Temp:   Resp: 14    Level of consciousness: awake, alert  and patient cooperative  Complications: No apparent anesthesia complications

## 2015-05-20 NOTE — Discharge Instructions (Signed)
Partial weight bearing with crutches May remove entire bandage in 24 hours, bathe, get wet, etc. Cover wounds with bandaids. Use ice starting tomorrow. Begin vigorous range of motion tomorrow.

## 2015-05-20 NOTE — Brief Op Note (Signed)
05/20/2015  9:01 AM  PATIENT:  Johnny Daniel  49 y.o. male  PRE-OPERATIVE DIAGNOSIS:  Tear of medial meniscus of left knee   POST-OPERATIVE DIAGNOSIS:  Tear of medial meniscus of left knee  PROCEDURE:  Procedure(s): Left knee arthroscopy with partial medial meniscectomy  (Left)  SURGEON:  Surgeon(s) and Role:    * Myra Rude, MD - Primary  PHYSICIAN ASSISTANT:   ASSISTANTS: none   ANESTHESIA:   none  EBL:  Total I/O In: -  Out: 10 [Blood:10]  BLOOD ADMINISTERED:none  DRAINS: none   LOCAL MEDICATIONS USED:  MARCAINE     SPECIMEN:  No Specimen  DISPOSITION OF SPECIMEN:  N/A  COUNTS:  YES  TOURNIQUET:    DICTATION: .Other Dictation: Dictation Number [id number]  PLAN OF CARE: Discharge to home after PACU  PATIENT DISPOSITION:  PACU - hemodynamically stable.   Delay start of Pharmacological VTE agent (>24hrs) due to surgical blood loss or risk of bleeding: not applicable

## 2015-05-20 NOTE — Transfer of Care (Signed)
Immediate Anesthesia Transfer of Care Note  Patient: Johnny Daniel  Procedure(s) Performed: Procedure(s): Left knee arthroscopy with partial medial meniscectomy  (Left)  Patient Location: PACU  Anesthesia Type:General  Level of Consciousness: sedated  Airway & Oxygen Therapy: Patient connected to face mask oxygen  Post-op Assessment: Report given to RN  Post vital signs: Reviewed and stable  Last Vitals:  Filed Vitals:   05/20/15 0620  BP: 128/93  Pulse: 57  Temp: 36.8 C    Complications: No apparent anesthesia complications

## 2015-05-20 NOTE — Anesthesia Procedure Notes (Signed)
Procedure Name: LMA Insertion Date/Time: 05/20/2015 7:35 AM Performed by: Junious Silk Pre-anesthesia Checklist: Patient identified, Emergency Drugs available, Suction available, Patient being monitored and Timeout performed Patient Re-evaluated:Patient Re-evaluated prior to inductionOxygen Delivery Method: Circle system utilized Preoxygenation: Pre-oxygenation with 100% oxygen Intubation Type: IV induction Ventilation: Mask ventilation without difficulty LMA: LMA inserted LMA Size: 4.5 Tube type: Oral Number of attempts: 1 Placement Confirmation: positive ETCO2 and breath sounds checked- equal and bilateral Tube secured with: Tape Dental Injury: Teeth and Oropharynx as per pre-operative assessment

## 2015-05-20 NOTE — H&P (Signed)
  49 year old male with meniscus tear left knee and persistent pain and disability.  History and physical has been placed in the chart as a paper document from my office.  Heart and lungs clear.  ENT normal.  Plan: arthroscopic partial medial menisectomy left knee.

## 2015-05-21 NOTE — Op Note (Signed)
Johnny Daniel, Johnny Daniel                ACCOUNT NO.:  [id number]  MEDICAL RECORD NO.:  [id number]  LOCATION:  [facility]                         FACILITY:  [facility]  PHYSICIAN:  Reita Chard, MD        DATE OF BIRTH:  1966-01-16  DATE OF PROCEDURE:  05/20/2015 DATE OF DISCHARGE:  05/20/2015                              OPERATIVE REPORT   PREOPERATIVE DIAGNOSIS:  Tear medial meniscus, posterior horn, left knee.  POSTOPERATIVE DIAGNOSIS:  Tear medial meniscus, posterior horn, left knee.  PROCEDURE PERFORMED:  Partial arthroscopic medial meniscectomy, left knee.  SURGEON:  Reita Chard, MD  SURGEON:  Reita Chard, MD  ANESTHESIA:  General.  COMPLICATIONS:  None.  DESCRIPTION OF PROCEDURE:  After adequate induction of general anesthesia, the left lower extremity was secured in the leg holder in the usual manner for arthroscopy.  The knee and lower leg are thoroughly prepped with alcohol and ChloraPrep and draped in standard sterile fashion.  The joint was infiltrated with 0.5% Marcaine with epinephrine. Diagnostic arthroscopy was performed in usual manner.  There was mild increased synovitis in the suprapatellar pouch.  The patellofemoral articulation is normal in the medial compartment.  There was seen to be an obvious complex tear of the posterior horn of the medial meniscus. There was some chondromalacia associated with this tear on the tibial plateau.  The femoral condyle was intact.  Using a combination of 3.5 radial resector, the basket forceps and the ArthroWand, the torn portion of the meniscus was resected back to a stable rim.  Careful probing demonstrates no residual tear in the intercondylar notch.  The anterior cruciate ligament is normal.  In the lateral compartment, the articular surfaces and the meniscus are normal.  The joint is thoroughly irrigated multiple times.  Skin edges were closed with 4-0 nylon.  The joint was infiltrated with 15 mL of Marcaine with epinephrine and 4  mg of morphine.  Soft bulky dressing was applied.  The patient returned to the recovery room in satisfactory condition, having tolerated the procedure quite well.          ______________________________ Reita Chard, MD     CS/MEDQ  D:  05/21/2015  T:  05/21/2015  Job:  [id number]

## 2015-05-28 ENCOUNTER — Institutional Professional Consult (permissible substitution): Payer: BC Managed Care – PPO | Admitting: Pulmonary Disease

## 2015-05-29 ENCOUNTER — Encounter: Payer: Self-pay | Admitting: Pulmonary Disease

## 2015-05-29 ENCOUNTER — Ambulatory Visit (INDEPENDENT_AMBULATORY_CARE_PROVIDER_SITE_OTHER): Payer: BC Managed Care – PPO | Admitting: Pulmonary Disease

## 2015-05-29 VITALS — BP 116/82 | HR 69 | Ht 68.0 in | Wt 219.0 lb

## 2015-05-29 DIAGNOSIS — G4733 Obstructive sleep apnea (adult) (pediatric): Secondary | ICD-10-CM

## 2015-05-29 NOTE — Patient Instructions (Signed)
Home sleep study - do not use your oral device that night

## 2015-05-29 NOTE — Assessment & Plan Note (Addendum)
Home sleep study - do not use your oral device that night. If he has mild OSA at baseline-then we'll consider repeating study after using the oral device  Given excessive daytime somnolence, narrow pharyngeal exam, witnessed apneas & loud snoring, obstructive sleep apnea is very likely & an overnight polysomnogram will be scheduled as a home study. The pathophysiology of obstructive sleep apnea , it's cardiovascular consequences & modes of treatment including CPAP were discused with the patient in detail & they evidenced understanding.

## 2015-05-29 NOTE — Progress Notes (Signed)
Subjective:    Patient ID: Johnny Daniel, male    DOB: 09-Feb-1966, 49 y.o.   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 His wife has noted loud snoring especially on his back. He reports gasping episodes that have occasionally woken him up from sleep. He reports excessive daytime fatigue. His wife got him an oral appliance after watching a TV advertisement-he has been using for the last 6 months and feels somewhat better. Epworth sleepiness score is 14 Bedtime is between 9 and 10 PM, sleep latency is up to an hour he reports some sleep onset insomnia for which she has taken melatonin. He sleeps on his side with one pillow. He reports 2-3 nocturnal awakenings including a bathroom visit and is out of bed by 5:30 AM feeling tired without dryness of mouth or headaches. He is gained 20 pounds in the last 6 years. He drinks 2 cups of tea in the morning and then 2 cups of coffee around 2 PM to keep himself going. There is no history suggestive of cataplexy, sleep paralysis or parasomnias He denies excessive alcohol intake  Past Medical History  Diagnosis Date  . HLD (hyperlipidemia)   . Obesity   . Headache     Past Surgical History  Procedure Laterality Date  . Knee cartilage surgery Right 1982    football injury  . Knee arthroscopy with medial menisectomy Left 05/20/2015    Procedure: Left knee arthroscopy with partial medial meniscectomy ;  Surgeon: Myra Rudehristopher Smith, MD;  Location: [facility] ORS;  Service: Orthopedics;  Laterality: Left;    No Known Allergies  History   Social History  . Marital Status: Married    Spouse Name: Lurena JoinerRebecca  . Number of Children: 2  . Years of Education: N/A   Occupational History  . Not on file.   Social History Main Topics  . Smoking status: Former Smoker -- 1.00 packs/day for 1 years    Types: Cigarettes    Start date: 11/30/1987    Quit date: 11/29/1989  . Smokeless tobacco: Never  Used  . Alcohol Use: 0.0 oz/week    0 Standard drinks or equivalent per week     Comment: Occasional  . Drug Use: No  . Sexual Activity: Not on file   Other Topics Concern  . Not on file   Social History Narrative   Lives with wife, 2 daughters, 2 dogs and a cat   SIL of Dorna LeitzWIlliam Simpson   Occupation: Engineering geologistinstructional coach for new teachers [employer]   Edu: PhD in education (critical literacy for social justice)   Activity: treadmill regularly   Diet: some water, fruits/vegetables daily     Review of Systems  Constitutional: Negative for fever, chills, activity change, appetite change and unexpected weight change.  HENT: Negative for congestion, dental problem, postnasal drip, rhinorrhea, sneezing, sore throat, trouble swallowing and voice change.   Eyes: Negative for visual disturbance.  Respiratory: Negative for cough, choking and shortness of breath.   Cardiovascular: Negative for chest pain and leg swelling.  Gastrointestinal: Negative for nausea, vomiting and abdominal pain.  Genitourinary: Negative for difficulty urinating.  Musculoskeletal: Negative for arthralgias.  Skin: Negative for rash.  Psychiatric/Behavioral: Negative for behavioral problems and confusion.       Objective:   Physical Exam  Gen. Pleasant, well-nourished, in no distress, normal affect ENT - no lesions, no post nasal drip Neck: No JVD, no thyromegaly, no carotid bruits Lungs: no use of  accessory muscles, no dullness to percussion, clear without rales or rhonchi  Cardiovascular: Rhythm regular, heart sounds  normal, no murmurs or gallops, no peripheral edema Abdomen: soft and non-tender, no hepatosplenomegaly, BS normal. Musculoskeletal: No deformities, no cyanosis or clubbing Neuro:  alert, non focal       Assessment & Plan:

## 2015-05-30 ENCOUNTER — Encounter: Payer: Self-pay | Admitting: Family Medicine

## 2015-06-05 ENCOUNTER — Other Ambulatory Visit: Payer: Self-pay | Admitting: Pulmonary Disease

## 2015-06-05 DIAGNOSIS — G4733 Obstructive sleep apnea (adult) (pediatric): Secondary | ICD-10-CM

## 2015-07-07 ENCOUNTER — Other Ambulatory Visit: Payer: Self-pay

## 2015-07-07 MED ORDER — PHENTERMINE HCL 30 MG PO CAPS: 30.0000 mg | ORAL_CAPSULE | ORAL | Status: DC

## 2015-07-07 NOTE — Telephone Encounter (Signed)
Rx called in as prescribed and left voicemail letting pt know Rx was sent in

## 2015-07-07 NOTE — Telephone Encounter (Signed)
Will refill times 1 in PCP absence

## 2015-07-07 NOTE — Telephone Encounter (Signed)
Pt left v/m requesting refill phenermine to [facility] [address] Pt last seen and rx last filled # 30 x 1 on 05/14/15. Pt has 3 mth f/u on 08/20/15. Dr Reece Agar is out of office. Pt is going out of town on 07/09/15 for 1 1/2 weeks. Pt wants to know if can get refilled prior to going out of town. Pt request cb.

## 2015-07-25 ENCOUNTER — Encounter (HOSPITAL_BASED_OUTPATIENT_CLINIC_OR_DEPARTMENT_OTHER): Payer: BC Managed Care – PPO

## 2015-08-20 ENCOUNTER — Ambulatory Visit (INDEPENDENT_AMBULATORY_CARE_PROVIDER_SITE_OTHER): Payer: BC Managed Care – PPO | Admitting: Family Medicine

## 2015-08-20 ENCOUNTER — Encounter: Payer: Self-pay | Admitting: Family Medicine

## 2015-08-20 VITALS — BP 122/78 | HR 64 | Temp 98.1°F | Wt 210.0 lb

## 2015-08-20 DIAGNOSIS — Z23 Encounter for immunization: Secondary | ICD-10-CM | POA: Diagnosis not present

## 2015-08-20 DIAGNOSIS — R739 Hyperglycemia, unspecified: Secondary | ICD-10-CM | POA: Diagnosis not present

## 2015-08-20 DIAGNOSIS — E669 Obesity, unspecified: Secondary | ICD-10-CM | POA: Diagnosis not present

## 2015-08-20 DIAGNOSIS — G4733 Obstructive sleep apnea (adult) (pediatric): Secondary | ICD-10-CM

## 2015-08-20 DIAGNOSIS — M25562 Pain in left knee: Secondary | ICD-10-CM

## 2015-08-20 MED ORDER — PHENTERMINE HCL 30 MG PO CAPS: 30.0000 mg | ORAL_CAPSULE | ORAL | Status: DC

## 2015-08-20 NOTE — Assessment & Plan Note (Signed)
Needs to reschedule sleep study.

## 2015-08-20 NOTE — Assessment & Plan Note (Signed)
Recent arthroscopy 04/2015, recovered remarkably well. Now has started walk-run program after approval by orthopedist

## 2015-08-20 NOTE — Progress Notes (Signed)
BP 122/78 mmHg  Pulse 64  Temp(Src) 98.1 F (36.7 C) (Oral)  Wt 210 lb (95.255 kg)   CC: 73mo f/u visit  Subjective:    Patient ID: Johnny Daniel, male    DOB: 1966/07/17, 49 y.o.   MRN: [id number]  HPI: Johnny Daniel is a 49 y.o. male presenting on 08/20/2015 for Follow-up   Obesity - phentermine not covered by insurance. Last visit we started phentermine. 9lb weight loss over last 3 months. He did have knee surgery 04/2015 - didn't start phentermine until 05/2015. Notices decreased appetite with stimulant. Walking outside 5d/wk for 60 min, started jogging 3 wks ago. Diet going well - recent vacation, maintained his weight. No headaches, chest pain, insomnia. Goal weight is 185 lbs.  OSA - saw Dr Vassie Loll, who recommended home overnight polysomnogram but insurance didn't cover - so he is planning on scheduling this at sleep lab. He does have oral appliance at home which he ordered from TV but has not been using.   Relevant past medical, surgical, family and social history reviewed and updated as indicated. Interim medical history since our last visit reviewed. Allergies and medications reviewed and updated. Current Outpatient Prescriptions on File Prior to Visit  Medication Sig  . aspirin 81 MG EC tablet Take 81 mg by mouth daily. Swallow whole.  . Melatonin 5 MG TABS Take 1 tablet by mouth at bedtime.  . rosuvastatin (CRESTOR) 40 MG tablet Take 1 tablet (40 mg total) by mouth daily.  . Turmeric 500 MG CAPS Take 1 capsule by mouth daily.  . Glucosamine 500 MG CAPS Take 1 capsule by mouth daily.   No current facility-administered medications on file prior to visit.    Review of Systems Per HPI unless specifically indicated above     Objective:    BP 122/78 mmHg  Pulse 64  Temp(Src) 98.1 F (36.7 C) (Oral)  Wt 210 lb (95.255 kg)  Wt Readings from Last 3 Encounters:  08/20/15 210 lb (95.255 kg)  05/29/15 219 lb (99.338 kg)  05/13/15 215 lb (97.523 kg)   Body mass index is 31.94  kg/(m^2).  Physical Exam  Constitutional: He appears well-developed and well-nourished. No distress.  HENT:  Mouth/Throat: Oropharynx is clear and moist. No oropharyngeal exudate.  Eyes: Conjunctivae and EOM are normal. Pupils are equal, round, and reactive to light.  Cardiovascular: Normal rate, regular rhythm, normal heart sounds and intact distal pulses.   No murmur heard. Pulmonary/Chest: Effort normal and breath sounds normal. No respiratory distress. He has no wheezes. He has no rales.  Musculoskeletal: He exhibits no edema.  Skin: Skin is warm and dry. No rash noted.  Psychiatric: He has a normal mood and affect.  Nursing note and vitals reviewed.  Results for orders placed or performed in visit on 03/07/15  TSH  Result Value Ref Range   TSH 1.95 0.35 - 4.50 uIU/mL  Lipid panel  Result Value Ref Range   Cholesterol 168 0 - 200 mg/dL   Triglycerides 86.5 0.0 - 149.0 mg/dL   HDL 78.46 >96.29 mg/dL   VLDL 52.8 0.0 - 41.3 mg/dL   LDL Cholesterol 244 (H) 0 - 99 mg/dL   Total CHOL/HDL Ratio 3    NonHDL 114.90   Comprehensive metabolic panel  Result Value Ref Range   Sodium 138 135 - 145 mEq/L   Potassium 4.4 3.5 - 5.1 mEq/L   Chloride 104 96 - 112 mEq/L   CO2 28 19 - 32 mEq/L   Glucose,  Bld 114 (H) 70 - 99 mg/dL   BUN 18 6 - 23 mg/dL   Creatinine, Ser 1.61 0.40 - 1.50 mg/dL   Total Bilirubin 1.1 0.2 - 1.2 mg/dL   Alkaline Phosphatase 50 39 - 117 U/L   AST 23 0 - 37 U/L   ALT 29 0 - 53 U/L   Total Protein 7.0 6.0 - 8.3 g/dL   Albumin 4.1 3.5 - 5.2 g/dL   Calcium 9.5 8.4 - 09.6 mg/dL   GFR 04.54 >09.81 mL/min      Assessment & Plan:   Problem List Items Addressed This Visit    OSA (obstructive sleep apnea)    Needs to reschedule sleep study.      Obesity, Class I, BMI 30-34.9    Continue phentermine  for appetite suppression. Reviewed healthy diet and lifestyle changes. Refilled today. RTC 3 mo.      Relevant Medications   phentermine 30 MG capsule    Left knee pain    Recent arthroscopy 04/2015, recovered remarkably well. Now has started walk-run program after approval by orthopedist      Hyperglycemia    Anticipate improvement with weight loss noted. Consider checking labs next visit.       Other Visit Diagnoses    Need for influenza vaccination    -  Primary    Relevant Orders    Flu Vaccine QUAD 36+ mos IM (Fluarix & Fluzone Quad PF (Completed)        Follow up plan: Return in about 3 months (around 11/19/2015), or as needed, for follow up visit.

## 2015-08-20 NOTE — Assessment & Plan Note (Signed)
Continue phentermine  for appetite suppression. Reviewed healthy diet and lifestyle changes. Refilled today. RTC 3 mo.

## 2015-08-20 NOTE — Assessment & Plan Note (Signed)
Anticipate improvement with weight loss noted. Consider checking labs next visit.

## 2015-08-20 NOTE — Progress Notes (Signed)
Pre visit review using our clinic review tool, if applicable. No additional management support is needed unless otherwise documented below in the visit note. 

## 2015-08-20 NOTE — Patient Instructions (Addendum)
Flu shot today. Continue phentermine, continue walk/run program. Continue healthy diet changes.  Return in 3 months for follow up.

## 2015-11-28 ENCOUNTER — Ambulatory Visit: Payer: BC Managed Care – PPO | Admitting: Family Medicine

## 2015-11-30 HISTORY — PX: TRIGGER FINGER RELEASE: SHX641

## 2015-12-07 ENCOUNTER — Encounter: Payer: Self-pay | Admitting: Family Medicine

## 2015-12-08 ENCOUNTER — Ambulatory Visit: Payer: BC Managed Care – PPO | Admitting: Family Medicine

## 2015-12-15 ENCOUNTER — Encounter: Payer: Self-pay | Admitting: Family Medicine

## 2015-12-15 ENCOUNTER — Ambulatory Visit (INDEPENDENT_AMBULATORY_CARE_PROVIDER_SITE_OTHER): Payer: BC Managed Care – PPO | Admitting: Family Medicine

## 2015-12-15 VITALS — BP 130/80 | HR 64 | Temp 98.1°F | Wt 205.2 lb

## 2015-12-15 DIAGNOSIS — G4733 Obstructive sleep apnea (adult) (pediatric): Secondary | ICD-10-CM

## 2015-12-15 DIAGNOSIS — E785 Hyperlipidemia, unspecified: Secondary | ICD-10-CM | POA: Diagnosis not present

## 2015-12-15 DIAGNOSIS — E669 Obesity, unspecified: Secondary | ICD-10-CM

## 2015-12-15 MED ORDER — PHENTERMINE HCL 30 MG PO CAPS: 30.0000 mg | ORAL_CAPSULE | ORAL | Status: DC

## 2015-12-15 NOTE — Progress Notes (Signed)
   BP 130/80 mmHg  Pulse 64  Temp(Src) 98.1 F (36.7 C) (Oral)  Wt 205 lb 4 oz (93.101 kg)   CC: 3-4 mo f/u visit  Subjective:    Patient ID: Johnny Daniel, male    DOB: [birth year], 50 y.o.   MRN: [id number]  HPI: Johnny RummageMark Thurgood is a 50 y.o. male presenting on 12/15/2015 for Follow-up   OSA - has oral appliance. Has seen Dr Vassie LollAlva. Has not done sleep study yet. Weight loss has significantly helped.   HLD - complaint with crestor without myalgias.  Obesity - on phentermine 30mg  daily. Paying out of pocket. 5 lb weight loss. Lost more but gained some over Christmas. Also tweaked knee over christmas. Just restarted running. Currently running 30 min at a time. Using knee brace. Has been told has significant knee arthritis.   Relevant past medical, surgical, family and social history reviewed and updated as indicated. Interim medical history since our last visit reviewed. Allergies and medications reviewed and updated. Current Outpatient Prescriptions on File Prior to Visit  Medication Sig  . aspirin 81 MG EC tablet Take 81 mg by mouth daily. Swallow whole.  . rosuvastatin (CRESTOR) 40 MG tablet Take 1 tablet (40 mg total) by mouth daily.  . Turmeric 500 MG CAPS Take 1 capsule by mouth daily.  . Glucosamine 500 MG CAPS Take 1 capsule by mouth daily. Reported on 12/15/2015  . Melatonin 5 MG TABS Take 1 tablet by mouth at bedtime. Reported on 12/15/2015   No current facility-administered medications on file prior to visit.    Review of Systems Per HPI unless specifically indicated in ROS section     Objective:    BP 130/80 mmHg  Pulse 64  Temp(Src) 98.1 F (36.7 C) (Oral)  Wt 205 lb 4 oz (93.101 kg)  Wt Readings from Last 3 Encounters:  12/15/15 205 lb 4 oz (93.101 kg)  08/20/15 210 lb (95.255 kg)  05/29/15 219 lb (99.338 kg)   Body mass index is 31.22 kg/(m^2). Physical Exam  Constitutional: He appears well-developed and well-nourished. No distress.  HENT:  Mouth/Throat:  Oropharynx is clear and moist. No oropharyngeal exudate.  Eyes: Conjunctivae and EOM are normal. Pupils are equal, round, and reactive to light. No scleral icterus.  Neck: Normal range of motion. Neck supple. No thyromegaly present.  Cardiovascular: Normal rate, regular rhythm, normal heart sounds and intact distal pulses.   No murmur heard. Pulmonary/Chest: Effort normal and breath sounds normal. No respiratory distress. He has no wheezes. He has no rales.  Musculoskeletal: He exhibits no edema.  Lymphadenopathy:    He has no cervical adenopathy.  Skin: Skin is warm and dry. No rash noted.  Nursing note and vitals reviewed.     Assessment & Plan:   Problem List Items Addressed This Visit    OSA (obstructive sleep apnea)    Significantly improved with weight loss. Declines further eval at this time.       Obesity, Class I, BMI 30-34.9    Congratulated on continued weight loss noted. Discussed healthy diet and lifestyle changes to affect sustainable weight loss. Continue 30mg  phentermine for now.       Relevant Medications   phentermine 30 MG capsule   HLD (hyperlipidemia) - Primary    Chronic, stable. Continue current regimen.          Follow up plan: Return in about 4 months (around 04/13/2016), or as needed, for annual exam, prior fasting for blood work.

## 2015-12-15 NOTE — Assessment & Plan Note (Signed)
Congratulated on continued weight loss noted. Discussed healthy diet and lifestyle changes to affect sustainable weight loss. Continue 30mg  phentermine for now.

## 2015-12-15 NOTE — Assessment & Plan Note (Signed)
Significantly improved with weight loss. Declines further eval at this time.

## 2015-12-15 NOTE — Assessment & Plan Note (Signed)
Chronic, stable. Continue current regimen. 

## 2015-12-15 NOTE — Progress Notes (Signed)
Pre visit review using our clinic review tool, if applicable. No additional management support is needed unless otherwise documented below in the visit note. 

## 2015-12-15 NOTE — Patient Instructions (Addendum)
You are doing well today - continue current medicines.  Return as needed or in 4 months for physical.

## 2016-04-04 ENCOUNTER — Other Ambulatory Visit: Payer: Self-pay | Admitting: Family Medicine

## 2016-04-04 DIAGNOSIS — E785 Hyperlipidemia, unspecified: Secondary | ICD-10-CM

## 2016-04-04 DIAGNOSIS — E66811 Obesity, class 1: Secondary | ICD-10-CM

## 2016-04-04 DIAGNOSIS — R739 Hyperglycemia, unspecified: Secondary | ICD-10-CM

## 2016-04-04 DIAGNOSIS — Z125 Encounter for screening for malignant neoplasm of prostate: Secondary | ICD-10-CM

## 2016-04-04 DIAGNOSIS — E669 Obesity, unspecified: Secondary | ICD-10-CM

## 2016-04-06 ENCOUNTER — Other Ambulatory Visit (INDEPENDENT_AMBULATORY_CARE_PROVIDER_SITE_OTHER): Payer: BC Managed Care – PPO

## 2016-04-06 DIAGNOSIS — R739 Hyperglycemia, unspecified: Secondary | ICD-10-CM

## 2016-04-06 DIAGNOSIS — Z125 Encounter for screening for malignant neoplasm of prostate: Secondary | ICD-10-CM

## 2016-04-06 DIAGNOSIS — E785 Hyperlipidemia, unspecified: Secondary | ICD-10-CM | POA: Diagnosis not present

## 2016-04-06 LAB — LIPID PANEL
CHOL/HDL RATIO: 3
CHOLESTEROL: 164 mg/dL (ref 0–200)
HDL: 64.6 mg/dL (ref >39.00)
LDL CALC: 83 mg/dL (ref 0–99)
NonHDL: 99.6
Triglycerides: 84 mg/dL (ref 0.0–149.0)
VLDL: 16.8 mg/dL (ref 0.0–40.0)

## 2016-04-06 LAB — BASIC METABOLIC PANEL
BUN: 18 mg/dL (ref 6–23)
CO2: 30 mEq/L (ref 19–32)
Calcium: 10 mg/dL (ref 8.4–10.5)
Chloride: 102 mEq/L (ref 96–112)
Creatinine, Ser: 0.97 mg/dL (ref 0.40–1.50)
GFR: 86.97 mL/min (ref >60.00)
GLUCOSE: 128 mg/dL — AB (ref 70–99)
Potassium: 4.9 mEq/L (ref 3.5–5.1)
SODIUM: 139 meq/L (ref 135–145)

## 2016-04-06 LAB — HEMOGLOBIN A1C: HEMOGLOBIN A1C: 5.8 % (ref 4.6–6.5)

## 2016-04-06 LAB — PSA: PSA: 0.81 ng/mL (ref 0.10–4.00)

## 2016-04-13 ENCOUNTER — Encounter: Payer: BC Managed Care – PPO | Admitting: Family Medicine

## 2016-05-02 ENCOUNTER — Other Ambulatory Visit: Payer: Self-pay | Admitting: Family Medicine

## 2016-05-30 ENCOUNTER — Other Ambulatory Visit: Payer: Self-pay | Admitting: Family Medicine

## 2016-05-31 NOTE — Telephone Encounter (Signed)
plz phone in. 

## 2016-05-31 NOTE — Telephone Encounter (Signed)
Rx called in as directed.   

## 2016-06-15 ENCOUNTER — Encounter: Payer: Self-pay | Admitting: Family Medicine

## 2016-06-15 ENCOUNTER — Ambulatory Visit (INDEPENDENT_AMBULATORY_CARE_PROVIDER_SITE_OTHER): Payer: BC Managed Care – PPO | Admitting: Family Medicine

## 2016-06-15 VITALS — BP 110/80 | HR 64 | Temp 98.4°F | Ht 67.25 in | Wt 201.2 lb

## 2016-06-15 DIAGNOSIS — E669 Obesity, unspecified: Secondary | ICD-10-CM | POA: Diagnosis not present

## 2016-06-15 DIAGNOSIS — E66811 Obesity, class 1: Secondary | ICD-10-CM

## 2016-06-15 DIAGNOSIS — Z1211 Encounter for screening for malignant neoplasm of colon: Secondary | ICD-10-CM | POA: Diagnosis not present

## 2016-06-15 DIAGNOSIS — R7303 Prediabetes: Secondary | ICD-10-CM

## 2016-06-15 DIAGNOSIS — Z0000 Encounter for general adult medical examination without abnormal findings: Secondary | ICD-10-CM | POA: Diagnosis not present

## 2016-06-15 DIAGNOSIS — E785 Hyperlipidemia, unspecified: Secondary | ICD-10-CM

## 2016-06-15 MED ORDER — ROSUVASTATIN CALCIUM 20 MG PO TABS: 20.0000 mg | ORAL_TABLET | Freq: Every day | ORAL | Status: DC

## 2016-06-15 NOTE — Assessment & Plan Note (Signed)
Chronic, stable. With weight loss, anticipate lower dose necessary. Will decrease to 20mg  daily.

## 2016-06-15 NOTE — Assessment & Plan Note (Signed)
Preventative protocols reviewed and updated unless pt declined. Discussed healthy diet and lifestyle.  

## 2016-06-15 NOTE — Patient Instructions (Addendum)
Try taper off phentermine. We will schedule you for colonoscopy. Decrease crestor (rosuvastatin) to 20mg  daily (new dose at pharmacy). You are doing well today. Continue healthy diet and lifestyle as up to now. Return as needed or in 1 year for next physical.  Health Maintenance, Male A healthy lifestyle and preventative care can promote health and wellness.  Maintain regular health, dental, and eye exams.  Eat a healthy diet. Foods like vegetables, fruits, whole grains, low-fat dairy products, and lean protein foods contain the nutrients you need and are low in calories. Decrease your intake of foods high in solid fats, added sugars, and salt. Get information about a proper diet from your health care provider, if necessary.  Regular physical exercise is one of the most important things you can do for your health. Most adults should get at least 150 minutes of moderate-intensity exercise (any activity that increases your heart rate and causes you to sweat) each week. In addition, most adults need muscle-strengthening exercises on 2 or more days a week.   Maintain a healthy weight. The body mass index (BMI) is a screening tool to identify possible weight problems. It provides an estimate of body fat based on height and weight. Your health care provider can find your BMI and can help you achieve or maintain a healthy weight. For males 20 years and older:  A BMI below 18.5 is considered underweight.  A BMI of 18.5 to 24.9 is normal.  A BMI of 25 to 29.9 is considered overweight.  A BMI of 30 and above is considered obese.  Maintain normal blood lipids and cholesterol by exercising and minimizing your intake of saturated fat. Eat a balanced diet with plenty of fruits and vegetables. Blood tests for lipids and cholesterol should begin at age 120 and be repeated every 5 years. If your lipid or cholesterol levels are high, you are over age 50, or you are at high risk for heart disease, you may need  your cholesterol levels checked more frequently.Ongoing high lipid and cholesterol levels should be treated with medicines if diet and exercise are not working.  If you smoke, find out from your health care provider how to quit. If you do not use tobacco, do not start.  Lung cancer screening is recommended for adults aged 55-80 years who are at high risk for developing lung cancer because of a history of smoking. A yearly low-dose CT scan of the lungs is recommended for people who have at least a 30-pack-year history of smoking and are current smokers or have quit within the past 15 years. A pack year of smoking is smoking an average of 1 pack of cigarettes a day for 1 year (for example, a 30-pack-year history of smoking could mean smoking 1 pack a day for 30 years or 2 packs a day for 15 years). Yearly screening should continue until the smoker has stopped smoking for at least 15 years. Yearly screening should be stopped for people who develop a health problem that would prevent them from having lung cancer treatment.  If you choose to drink alcohol, do not have more than 2 drinks per day. One drink is considered to be 12 oz (360 mL) of beer, 5 oz (150 mL) of wine, or 1.5 oz (45 mL) of liquor.  Avoid the use of street drugs. Do not share needles with anyone. Ask for help if you need support or instructions about stopping the use of drugs.  High blood pressure causes heart disease  and increases the risk of stroke. High blood pressure is more likely to develop in:  People who have blood pressure in the end of the normal range (100-139/85-89 mm Hg).  People who are overweight or obese.  People who are African American.  If you are 42-23 years of age, have your blood pressure checked every 3-5 years. If you are 10 years of age or older, have your blood pressure checked every year. You should have your blood pressure measured twice--once when you are at a hospital or clinic, and once when you are not  at a hospital or clinic. Record the average of the two measurements. To check your blood pressure when you are not at a hospital or clinic, you can use:  An automated blood pressure machine at a pharmacy.  A home blood pressure monitor.  If you are 19-42 years old, ask your health care provider if you should take aspirin to prevent heart disease.  Diabetes screening involves taking a blood sample to check your fasting blood sugar level. This should be done once every 3 years after age 60 if you are at a normal weight and without risk factors for diabetes. Testing should be considered at a younger age or be carried out more frequently if you are overweight and have at least 1 risk factor for diabetes.  Colorectal cancer can be detected and often prevented. Most routine colorectal cancer screening begins at the age of 98 and continues through age 39. However, your health care provider may recommend screening at an earlier age if you have risk factors for colon cancer. On a yearly basis, your health care provider may provide home test kits to check for hidden blood in the stool. A small camera at the end of a tube may be used to directly examine the colon (sigmoidoscopy or colonoscopy) to detect the earliest forms of colorectal cancer. Talk to your health care provider about this at age 69 when routine screening begins. A direct exam of the colon should be repeated every 5-10 years through age 9, unless early forms of precancerous polyps or small growths are found.  People who are at an increased risk for hepatitis B should be screened for this virus. You are considered at high risk for hepatitis B if:  You were born in a country where hepatitis B occurs often. Talk with your health care provider about which countries are considered high risk.  Your parents were born in a high-risk country and you have not received a shot to protect against hepatitis B (hepatitis B vaccine).  You have HIV or  AIDS.  You use needles to inject street drugs.  You live with, or have sex with, someone who has hepatitis B.  You are a man who has sex with other men (MSM).  You get hemodialysis treatment.  You take certain medicines for conditions like cancer, organ transplantation, and autoimmune conditions.  Hepatitis C blood testing is recommended for all people born from [birth year] through 1965 and any individual with known risk factors for hepatitis C.  Healthy men should no longer receive prostate-specific antigen (PSA) blood tests as part of routine cancer screening. Talk to your health care provider about prostate cancer screening.  Testicular cancer screening is not recommended for adolescents or adult males who have no symptoms. Screening includes self-exam, a health care provider exam, and other screening tests. Consult with your health care provider about any symptoms you have or any concerns you have about testicular cancer.  Practice safe sex. Use condoms and avoid high-risk sexual practices to reduce the spread of sexually transmitted infections (STIs).  You should be screened for STIs, including gonorrhea and chlamydia if:  You are sexually active and are younger than 24 years.  You are older than 24 years, and your health care provider tells you that you are at risk for this type of infection.  Your sexual activity has changed since you were last screened, and you are at an increased risk for chlamydia or gonorrhea. Ask your health care provider if you are at risk.  If you are at risk of being infected with HIV, it is recommended that you take a prescription medicine daily to prevent HIV infection. This is called pre-exposure prophylaxis (PrEP). You are considered at risk if:  You are a man who has sex with other men (MSM).  You are a heterosexual man who is sexually active with multiple partners.  You take drugs by injection.  You are sexually active with a partner who has  HIV.  Talk with your health care provider about whether you are at high risk of being infected with HIV. If you choose to begin PrEP, you should first be tested for HIV. You should then be tested every 3 months for as long as you are taking PrEP.  Use sunscreen. Apply sunscreen liberally and repeatedly throughout the day. You should seek shade when your shadow is shorter than you. Protect yourself by wearing long sleeves, pants, a wide-brimmed hat, and sunglasses year round whenever you are outdoors.  Tell your health care provider of new moles or changes in moles, especially if there is a change in shape or color. Also, tell your health care provider if a mole is larger than the size of a pencil eraser.  A one-time screening for abdominal aortic aneurysm (AAA) and surgical repair of large AAAs by ultrasound is recommended for men aged 87-75 years who are current or former smokers.  Stay current with your vaccines (immunizations).   This information is not intended to replace advice given to you by your health care provider. Make sure you discuss any questions you have with your health care provider.   Document Released: 05/13/2008 Document Revised: 12/06/2014 Document Reviewed: 04/12/2011 Elsevier Interactive Patient Education [employer].

## 2016-06-15 NOTE — Assessment & Plan Note (Signed)
Ongoing weight loss with phentermine. As he's been on med for 1 year, discussed trial off medication, then will update me with effect.

## 2016-06-15 NOTE — Progress Notes (Signed)
Pre visit review using our clinic review tool, if applicable. No additional management support is needed unless otherwise documented below in the visit note. 

## 2016-06-15 NOTE — Progress Notes (Signed)
BP 110/80 mmHg  Pulse 64  Temp(Src) 98.4 F (36.9 C) (Oral)  Ht 5' 7.25" (1.708 m)  Wt 201 lb 4 oz (91.286 kg)  BMI 31.29 kg/m2   CC: CPE  Subjective:    Patient ID: Johnny Daniel, male    DOB: 17-Jun-1966, 50 y.o.   MRN: [id number]  HPI: Johnny Daniel is a 50 y.o. male presenting on 06/15/2016 for Annual Exam   Obesity - on phentermine for last year. Alternating running and elliptical QOD depending on how knees feel. S/p bilateral knee arthroscopies.  Body mass index is 31.29 kg/(m^2).   Preventative: Colon cancer screening - discussed, would like colonoscopy. Prostate cancer screening - discussed. Would like yearly screening.  Flu shot yearly Td 2010 Seat belt use discussed Sunscreen use discussed. No changing moles on skin.   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 Interim medical history since our last visit reviewed. Allergies and medications reviewed and updated. Current Outpatient Prescriptions on File Prior to Visit  Medication Sig  . aspirin 81 MG EC tablet Take 81 mg by mouth daily. Swallow whole.  . Turmeric 500 MG CAPS Take 1 capsule by mouth daily.  . Glucosamine 500 MG CAPS Take 1 capsule by mouth daily. Reported on 06/15/2016  . Melatonin 5 MG TABS Take 1 tablet by mouth at bedtime. Reported on 06/15/2016   No current facility-administered medications on file prior to visit.    Review of Systems  Constitutional: Negative for fever, chills, activity change, appetite change, fatigue and unexpected weight change.  HENT: Negative for hearing loss.   Eyes: Negative for visual disturbance.  Respiratory: Negative for cough, chest tightness, shortness of  breath and wheezing.   Cardiovascular: Negative for chest pain, palpitations and leg swelling.  Gastrointestinal: Negative for nausea, vomiting, abdominal pain, diarrhea, constipation, blood in stool and abdominal distention.  Genitourinary: Negative for hematuria and difficulty urinating.  Musculoskeletal: Negative for myalgias, arthralgias and neck pain.  Skin: Negative for rash.  Neurological: Negative for dizziness, seizures, syncope and headaches.  Hematological: Negative for adenopathy. Does not bruise/bleed easily.  Psychiatric/Behavioral: Negative for dysphoric mood. The patient is not nervous/anxious.    Per HPI unless specifically indicated in ROS section     Objective:    BP 110/80 mmHg  Pulse 64  Temp(Src) 98.4 F (36.9 C) (Oral)  Ht 5' 7.25" (1.708 m)  Wt 201 lb 4 oz (91.286 kg)  BMI 31.29 kg/m2  Wt Readings from Last 3 Encounters:  06/15/16 201 lb 4 oz (91.286 kg)  12/15/15 205 lb 4 oz (93.101 kg)  08/20/15 210 lb (95.255 kg)    Physical Exam  Constitutional: He is oriented to person, place, and time. He appears well-developed and well-nourished. No distress.  HENT:  Head: Normocephalic and atraumatic.  Right Ear: Hearing, tympanic membrane, external ear and ear canal normal.  Left Ear: Hearing, tympanic membrane, external ear and ear canal normal.  Nose: Nose normal.  Mouth/Throat: Uvula is midline, oropharynx is clear and moist and mucous membranes are normal. No oropharyngeal exudate, posterior oropharyngeal edema or posterior oropharyngeal erythema.  Eyes: Conjunctivae and EOM are normal. Pupils are equal, round, and reactive to light. No scleral icterus.  Neck: Normal range of motion.  Neck supple. No thyromegaly present.  Cardiovascular: Normal rate, regular rhythm, normal heart sounds and intact distal pulses.   No murmur heard. Pulses:      Radial pulses are 2+ on the right side, and 2+ on the left side.  Pulmonary/Chest: Effort normal and breath sounds  normal. No respiratory distress. He has no wheezes. He has no rales.  Abdominal: Soft. Bowel sounds are normal. He exhibits no distension and no mass. There is no tenderness. There is no rebound and no guarding.  Genitourinary: Rectum normal and prostate normal. Rectal exam shows no external hemorrhoid, no internal hemorrhoid, no fissure, no mass, no tenderness and anal tone normal. Prostate is not enlarged (20gm) and not tender.  Musculoskeletal: Normal range of motion. He exhibits no edema.  Lymphadenopathy:    He has no cervical adenopathy.  Neurological: He is alert and oriented to person, place, and time.  CN grossly intact, station and gait intact  Skin: Skin is warm and dry. No rash noted.  Psychiatric: He has a normal mood and affect. His behavior is normal. Judgment and thought content normal.  Nursing note and vitals reviewed.  Results for orders placed or performed in visit on 04/06/16  Lipid panel  Result Value Ref Range   Cholesterol 164 0 - 200 mg/dL   Triglycerides 14.784.0 0.0 - 149.0 mg/dL   HDL 82.9564.60 >62.13>39.00 mg/dL   VLDL 08.616.8 0.0 - 57.840.0 mg/dL   LDL Cholesterol 83 0 - 99 mg/dL   Total CHOL/HDL Ratio 3    NonHDL 99.60   Basic metabolic panel  Result Value Ref Range   Sodium 139 135 - 145 mEq/L   Potassium 4.9 3.5 - 5.1 mEq/L   Chloride 102 96 - 112 mEq/L   CO2 30 19 - 32 mEq/L   Glucose, Bld 128 (H) 70 - 99 mg/dL   BUN 18 6 - 23 mg/dL   Creatinine, Ser 4.690.97 0.40 - 1.50 mg/dL   Calcium 62.910.0 8.4 - 52.810.5 mg/dL   GFR 41.3286.97 >44.01>60.00 mL/min  Hemoglobin A1c  Result Value Ref Range   Hgb A1c MFr Bld 5.8 4.6 - 6.5 %  PSA  Result Value Ref Range   PSA 0.81 0.10 - 4.00 ng/mL      Assessment & Plan:   Problem List Items Addressed This Visit    Health maintenance examination - Primary    Preventative protocols reviewed and updated unless pt declined. Discussed healthy diet and lifestyle.       HLD (hyperlipidemia)    Chronic, stable. With weight loss, anticipate lower  dose necessary. Will decrease to 20mg  daily.        Relevant Medications   rosuvastatin (CRESTOR) 20 MG tablet   Obesity, Class I, BMI 30-34.9    Ongoing weight loss with phentermine. As he's been on med for 1 year, discussed trial off medication, then will update me with effect.       Prediabetes    Discussed healthy diet changes to maintain good glycemic control.       Other Visit Diagnoses    Special screening for malignant neoplasms, colon        Relevant Orders    Ambulatory referral to Gastroenterology        Follow up plan: Return in about 1 year (around 06/15/2017), or as needed, for annual exam, prior fasting for blood work.  Eustaquio BoydenJavier Pankaj Haack, MD

## 2016-06-15 NOTE — Assessment & Plan Note (Signed)
Discussed healthy diet changes to maintain good glycemic control.

## 2016-07-19 ENCOUNTER — Encounter: Payer: Self-pay | Admitting: Family Medicine

## 2017-05-09 ENCOUNTER — Encounter: Payer: Self-pay | Admitting: Internal Medicine

## 2017-06-12 ENCOUNTER — Other Ambulatory Visit: Payer: Self-pay | Admitting: Family Medicine

## 2017-06-12 DIAGNOSIS — R7303 Prediabetes: Secondary | ICD-10-CM

## 2017-06-12 DIAGNOSIS — Z125 Encounter for screening for malignant neoplasm of prostate: Secondary | ICD-10-CM

## 2017-06-12 DIAGNOSIS — E785 Hyperlipidemia, unspecified: Secondary | ICD-10-CM

## 2017-06-15 ENCOUNTER — Other Ambulatory Visit (INDEPENDENT_AMBULATORY_CARE_PROVIDER_SITE_OTHER): Payer: BC Managed Care – PPO

## 2017-06-15 DIAGNOSIS — E785 Hyperlipidemia, unspecified: Secondary | ICD-10-CM | POA: Diagnosis not present

## 2017-06-15 DIAGNOSIS — Z125 Encounter for screening for malignant neoplasm of prostate: Secondary | ICD-10-CM

## 2017-06-15 DIAGNOSIS — R7303 Prediabetes: Secondary | ICD-10-CM

## 2017-06-15 LAB — COMPREHENSIVE METABOLIC PANEL
ALT: 18 U/L (ref 0–53)
AST: 18 U/L (ref 0–37)
Albumin: 4.4 g/dL (ref 3.5–5.2)
Alkaline Phosphatase: 48 U/L (ref 39–117)
BUN: 22 mg/dL (ref 6–23)
CALCIUM: 9.8 mg/dL (ref 8.4–10.5)
CO2: 27 meq/L (ref 19–32)
CREATININE: 0.97 mg/dL (ref 0.40–1.50)
Chloride: 103 mEq/L (ref 96–112)
GFR: 86.56 mL/min (ref >60.00)
Glucose, Bld: 109 mg/dL — ABNORMAL HIGH (ref 70–99)
Potassium: 4.4 mEq/L (ref 3.5–5.1)
Sodium: 138 mEq/L (ref 135–145)
Total Bilirubin: 1.4 mg/dL — ABNORMAL HIGH (ref 0.2–1.2)
Total Protein: 7 g/dL (ref 6.0–8.3)

## 2017-06-15 LAB — LIPID PANEL
CHOLESTEROL: 175 mg/dL (ref 0–200)
HDL: 60.5 mg/dL (ref >39.00)
LDL CALC: 96 mg/dL (ref 0–99)
NonHDL: 114.49
Total CHOL/HDL Ratio: 3
Triglycerides: 93 mg/dL (ref 0.0–149.0)
VLDL: 18.6 mg/dL (ref 0.0–40.0)

## 2017-06-15 LAB — HEMOGLOBIN A1C: Hgb A1c MFr Bld: 5.5 % (ref 4.6–6.5)

## 2017-06-15 LAB — PSA: PSA: 0.83 ng/mL (ref 0.10–4.00)

## 2017-06-21 ENCOUNTER — Encounter: Payer: Self-pay | Admitting: Family Medicine

## 2017-06-21 ENCOUNTER — Ambulatory Visit (INDEPENDENT_AMBULATORY_CARE_PROVIDER_SITE_OTHER): Payer: BC Managed Care – PPO | Admitting: Family Medicine

## 2017-06-21 VITALS — BP 132/84 | HR 58 | Temp 98.3°F | Ht 67.5 in | Wt 215.0 lb

## 2017-06-21 DIAGNOSIS — E669 Obesity, unspecified: Secondary | ICD-10-CM

## 2017-06-21 DIAGNOSIS — R739 Hyperglycemia, unspecified: Secondary | ICD-10-CM | POA: Diagnosis not present

## 2017-06-21 DIAGNOSIS — E785 Hyperlipidemia, unspecified: Secondary | ICD-10-CM

## 2017-06-21 DIAGNOSIS — Z0000 Encounter for general adult medical examination without abnormal findings: Secondary | ICD-10-CM | POA: Diagnosis not present

## 2017-06-21 DIAGNOSIS — E66811 Obesity, class 1: Secondary | ICD-10-CM

## 2017-06-21 DIAGNOSIS — B351 Tinea unguium: Secondary | ICD-10-CM

## 2017-06-21 MED ORDER — ROSUVASTATIN CALCIUM 20 MG PO TABS: 20.0000 mg | ORAL_TABLET | Freq: Every day | ORAL | 3 refills | Status: DC

## 2017-06-21 NOTE — Assessment & Plan Note (Signed)
Preventative protocols reviewed and updated unless pt declined. Discussed healthy diet and lifestyle.  

## 2017-06-21 NOTE — Assessment & Plan Note (Signed)
Left foot - actually improving with mix of listerine and apple cider vinegar - continue this. Pt desires to avoid med if able.

## 2017-06-21 NOTE — Assessment & Plan Note (Signed)
Chronic, stable. Continue current regimen of lower crestor 20mg  dose.

## 2017-06-21 NOTE — Assessment & Plan Note (Signed)
Discussed healthy diet and lifestyle changes to affect sustainable weight loss  

## 2017-06-21 NOTE — Progress Notes (Signed)
BP 132/84 (BP Location: Left Arm, Patient Position: Sitting, Cuff Size: Large)   Pulse (!) 58   Temp 98.3 F (36.8 C) (Oral)   Ht 5' 7.5" (1.715 m)   Wt 215 lb (97.5 kg)   SpO2 95%   BMI 33.18 kg/m    CC: CPE Subjective:    Patient ID: Johnny Daniel, male    DOB: [birth year], 51 y.o.   MRN: [id number]  HPI: Johnny RummageMark Sagan is a 51 y.o. male presenting on 06/21/2017 for Annual Exam (Colonoscopy scheduled in August. Question about lab work)   R hand [facility] last week for trigger finger [facility]. Taking ibuprofen 800mg  PRN.  Still cutting higher crestor dose in half to reach 20mg  daily.  Toenail fungus ongoing for years - treating with home remedy of apple cider vinegar with listerine. May be improving.   Preventative: Colon cancer screening - upcoming colonoscopy 06/2017 Leone Payor([facility]).  Prostate cancer screening - discussed. Would like yearly screening.  Flu shot yearly.  Td 2010.  Seat belt use discussed.  Sunscreen use discussed. No changing moles on skin.  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 Interim medical history since our last visit reviewed. Allergies and medications reviewed and updated. Outpatient Medications Prior to Visit  Medication Sig Dispense Refill  . aspirin 81 MG EC tablet Take 81 mg by mouth daily. Swallow whole.    Marland Kitchen. GARLIC PO Take 1 capsule by mouth 2 (two) times daily at 8 am and 10 pm.    . Turmeric 500 MG CAPS Take 1 capsule by mouth 2 (two) times daily.     . Glucosamine 500 MG CAPS Take 1 capsule by mouth daily. Reported on 06/15/2016    .  rosuvastatin (CRESTOR) 20 MG tablet Take 1 tablet (20 mg total) by mouth daily. 90 tablet 3  . Melatonin 5 MG TABS Take 1 tablet by mouth at bedtime. Reported on 06/15/2016     No facility-administered medications prior to visit.      Per HPI unless specifically indicated in ROS section below Review of Systems  Constitutional: Negative for activity change, appetite change, chills, fatigue, fever and unexpected weight change.  HENT: Negative for hearing loss.   Eyes: Negative for visual disturbance.  Respiratory: Negative for cough, chest tightness, shortness of breath and wheezing.   Cardiovascular: Negative for chest pain, palpitations and leg swelling.  Gastrointestinal: Negative for abdominal distention, abdominal pain, blood in stool, constipation, diarrhea, nausea and vomiting.  Genitourinary: Negative for difficulty urinating and hematuria.  Musculoskeletal: Negative for arthralgias, myalgias and neck pain.  Skin: Negative for rash.  Neurological: Negative for dizziness, seizures, syncope and headaches.  Hematological: Negative for adenopathy. Does not bruise/bleed easily.  Psychiatric/Behavioral: Negative for dysphoric mood. The patient is not nervous/anxious.        Objective:    BP 132/84 (BP Location: Left Arm, Patient Position: Sitting, Cuff Size: Large)   Pulse (!) 58   Temp 98.3 F (36.8 C) (Oral)   Ht 5' 7.5" (1.715 m)   Wt 215 lb (97.5 kg)   SpO2 95%   BMI 33.18 kg/m  Wt Readings from Last 3 Encounters:  06/21/17 215 lb (97.5 kg)  06/15/16 201 lb 4 oz (91.3 kg)  12/15/15 205 lb 4 oz (93.1 kg)    Physical Exam  Constitutional: He is oriented to person, place, and time. He appears well-developed and well-nourished. No distress.  HENT:  Head: Normocephalic and atraumatic.  Right Ear: Hearing, tympanic membrane, external ear and ear canal normal.  Left Ear: Hearing, tympanic membrane, external ear and ear canal normal.  Nose: Nose normal.  Mouth/Throat: Uvula  is midline, oropharynx is clear and moist and mucous membranes are normal. No oropharyngeal exudate, posterior oropharyngeal edema or posterior oropharyngeal erythema.  Eyes: Pupils are equal, round, and reactive to light. Conjunctivae and EOM are normal. No scleral icterus.  Neck: Normal range of motion. Neck supple. No thyromegaly present.  Cardiovascular: Normal rate, regular rhythm, normal heart sounds and intact distal pulses.   No murmur heard. Pulses:      Radial pulses are 2+ on the right side, and 2+ on the left side.  Pulmonary/Chest: Effort normal and breath sounds normal. No respiratory distress. He has no wheezes. He has no rales.  Abdominal: Soft. Bowel sounds are normal. He exhibits no distension and no mass. There is no tenderness. There is no rebound and no guarding.  Genitourinary: Rectum normal and prostate normal. Rectal exam shows no external hemorrhoid, no internal hemorrhoid, no fissure, no mass, no tenderness and anal tone normal. Prostate is not enlarged and not tender.  Musculoskeletal: Normal range of motion. He exhibits no edema.  Lymphadenopathy:    He has no cervical adenopathy.  Neurological: He is alert and oriented to person, place, and time.  CN grossly intact, station and gait intact  Skin: Skin is warm and dry. No rash noted.  Psychiatric: He has a normal mood and affect. His behavior is normal. Judgment and thought content normal.  Nursing note and vitals reviewed.  Results for orders placed or performed in visit on 06/15/17  Lipid panel  Result Value Ref Range   Cholesterol 175 0 - 200 mg/dL   Triglycerides 60.4 0.0 - 149.0 mg/dL   HDL 54.09 >81.19 mg/dL   VLDL 14.7 0.0 - 82.9 mg/dL   LDL Cholesterol 96 0 - 99 mg/dL   Total CHOL/HDL Ratio 3    NonHDL 114.49   Hemoglobin A1c  Result Value Ref Range   Hgb A1c MFr Bld 5.5 4.6 - 6.5 %  PSA  Result Value Ref Range   PSA 0.83 0.10 - 4.00 ng/mL  Comprehensive metabolic panel  Result Value Ref Range     Sodium 138 135 - 145 mEq/L   Potassium 4.4 3.5 - 5.1 mEq/L   Chloride 103 96 - 112 mEq/L   CO2 27 19 - 32 mEq/L   Glucose, Bld 109 (H) 70 - 99 mg/dL   BUN 22 6 - 23 mg/dL   Creatinine, Ser 5.62 0.40 - 1.50 mg/dL   Total Bilirubin 1.4 (H) 0.2 - 1.2 mg/dL   Alkaline Phosphatase 48 39 - 117 U/L   AST 18 0 - 37 U/L   ALT 18 0 - 53 U/L   Total Protein 7.0 6.0 - 8.3 g/dL   Albumin 4.4 3.5 - 5.2 g/dL   Calcium 9.8 8.4 - 13.0 mg/dL   GFR 86.57 >84.69 mL/min      Assessment & Plan:   Problem List Items Addressed This Visit    Health maintenance examination - Primary    Preventative protocols reviewed and  updated unless pt declined. Discussed healthy diet and lifestyle.       HLD (hyperlipidemia)    Chronic, stable. Continue current regimen of lower crestor 20mg  dose.       Relevant Medications   rosuvastatin (CRESTOR) 20 MG tablet   Hyperglycemia    Stable readings, A1c below prediabetes range.  Pt aware of caution with sugars      Obesity, Class I, BMI 30-34.9    Discussed healthy diet and lifestyle changes to affect sustainable weight loss.      Onychomycosis    Left foot - actually improving with mix of listerine and apple cider vinegar - continue this. Pt desires to avoid med if able.           Follow up plan: Return in about 1 year (around 06/21/2018) for annual exam, prior fasting for blood work.  Eustaquio Boyden, MD

## 2017-06-21 NOTE — Assessment & Plan Note (Addendum)
Stable readings, A1c below prediabetes range.  Pt aware of caution with sugars

## 2017-06-21 NOTE — Patient Instructions (Addendum)
You are doing well today. Continue home remedy as it seems to be helping.  Return as needed or in 1 year for next physical.  Health Maintenance, Male A healthy lifestyle and preventive care is important for your health and wellness. Ask your health care provider about what schedule of regular examinations is right for you. What should I know about weight and diet? Eat a Healthy Diet  Eat plenty of vegetables, fruits, whole grains, low-fat dairy products, and lean protein.  Do not eat a lot of foods high in solid fats, added sugars, or salt.  Maintain a Healthy Weight Regular exercise can help you achieve or maintain a healthy weight. You should:  Do at least 150 minutes of exercise each week. The exercise should increase your heart rate and make you sweat (moderate-intensity exercise).  Do strength-training exercises at least twice a week.  Watch Your Levels of Cholesterol and Blood Lipids  Have your blood tested for lipids and cholesterol every 5 years starting at 51 years of age. If you are at high risk for heart disease, you should start having your blood tested when you are 51 years old. You may need to have your cholesterol levels checked more often if: ? Your lipid or cholesterol levels are high. ? You are older than 51 years of age. ? You are at high risk for heart disease.  What should I know about cancer screening? Many types of cancers can be detected early and may often be prevented. Lung Cancer  You should be screened every year for lung cancer if: ? You are a current smoker who has smoked for at least 30 years. ? You are a former smoker who has quit within the past 15 years.  Talk to your health care provider about your screening options, when you should start screening, and how often you should be screened.  Colorectal Cancer  Routine colorectal cancer screening usually begins at 51 years of age and should be repeated every 5-10 years until you are 51 years old.  You may need to be screened more often if early forms of precancerous polyps or small growths are found. Your health care provider may recommend screening at an earlier age if you have risk factors for colon cancer.  Your health care provider may recommend using home test kits to check for hidden blood in the stool.  A small camera at the end of a tube can be used to examine your colon (sigmoidoscopy or colonoscopy). This checks for the earliest forms of colorectal cancer.  Prostate and Testicular Cancer  Depending on your age and overall health, your health care provider may do certain tests to screen for prostate and testicular cancer.  Talk to your health care provider about any symptoms or concerns you have about testicular or prostate cancer.  Skin Cancer  Check your skin from head to toe regularly.  Tell your health care provider about any new moles or changes in moles, especially if: ? There is a change in a mole's size, shape, or color. ? You have a mole that is larger than a pencil eraser.  Always use sunscreen. Apply sunscreen liberally and repeat throughout the day.  Protect yourself by wearing long sleeves, pants, a wide-brimmed hat, and sunglasses when outside.  What should I know about heart disease, diabetes, and high blood pressure?  If you are 5618-51 years of age, have your blood pressure checked every 3-5 years. If you are 51 years of age or older,  have your blood pressure checked every year. You should have your blood pressure measured twice-once when you are at a hospital or clinic, and once when you are not at a hospital or clinic. Record the average of the two measurements. To check your blood pressure when you are not at a hospital or clinic, you can use: ? An automated blood pressure machine at a pharmacy. ? A home blood pressure monitor.  Talk to your health care provider about your target blood pressure.  If you are between 57-90 years old, ask your health  care provider if you should take aspirin to prevent heart disease.  Have regular diabetes screenings by checking your fasting blood sugar level. ? If you are at a normal weight and have a low risk for diabetes, have this test once every three years after the age of 88. ? If you are overweight and have a high risk for diabetes, consider being tested at a younger age or more often.  A one-time screening for abdominal aortic aneurysm (AAA) by ultrasound is recommended for men aged 55-75 years who are current or former smokers. What should I know about preventing infection? Hepatitis B If you have a higher risk for hepatitis B, you should be screened for this virus. Talk with your health care provider to find out if you are at risk for hepatitis B infection. Hepatitis C Blood testing is recommended for:  Everyone born from [birth year] through 1965.  Anyone with known risk factors for hepatitis C.  Sexually Transmitted Diseases (STDs)  You should be screened each year for STDs including gonorrhea and chlamydia if: ? You are sexually active and are younger than 51 years of age. ? You are older than 51 years of age and your health care provider tells you that you are at risk for this type of infection. ? Your sexual activity has changed since you were last screened and you are at an increased risk for chlamydia or gonorrhea. Ask your health care provider if you are at risk.  Talk with your health care provider about whether you are at high risk of being infected with HIV. Your health care provider may recommend a prescription medicine to help prevent HIV infection.  What else can I do?  Schedule regular health, dental, and eye exams.  Stay current with your vaccines (immunizations).  Do not use any tobacco products, such as cigarettes, chewing tobacco, and e-cigarettes. If you need help quitting, ask your health care provider.  Limit alcohol intake to no more than 2 drinks per day. One drink  equals 12 ounces of beer, 5 ounces of wine, or 1 ounces of hard liquor.  Do not use street drugs.  Do not share needles.  Ask your health care provider for help if you need support or information about quitting drugs.  Tell your health care provider if you often feel depressed.  Tell your health care provider if you have ever been abused or do not feel safe at home. This information is not intended to replace advice given to you by your health care provider. Make sure you discuss any questions you have with your health care provider. Document Released: 05/13/2008 Document Revised: 07/14/2016 Document Reviewed: 08/19/2015 Elsevier Interactive Patient Education  [employer].

## 2017-06-29 HISTORY — PX: COLONOSCOPY: SHX174

## 2017-07-14 ENCOUNTER — Ambulatory Visit (AMBULATORY_SURGERY_CENTER): Payer: Self-pay | Admitting: *Deleted

## 2017-07-14 VITALS — Ht 68.0 in | Wt 215.0 lb

## 2017-07-14 DIAGNOSIS — Z1211 Encounter for screening for malignant neoplasm of colon: Secondary | ICD-10-CM

## 2017-07-14 NOTE — Progress Notes (Signed)
No egg or soy allergy known to patient  No issues with past sedation with any surgeries  or procedures, no intubation problems  No diet pills per patient No home 02 use per patient  No blood thinners per patient  Pt denies issues with constipation  No A fib or A flutter  EMMI video sent to pt's e mail  

## 2017-07-18 ENCOUNTER — Encounter: Payer: Self-pay | Admitting: Internal Medicine

## 2017-07-28 ENCOUNTER — Encounter: Payer: Self-pay | Admitting: Internal Medicine

## 2017-07-28 ENCOUNTER — Ambulatory Visit (AMBULATORY_SURGERY_CENTER): Payer: BC Managed Care – PPO | Admitting: Internal Medicine

## 2017-07-28 VITALS — BP 118/58 | HR 60 | Temp 97.8°F | Resp 10 | Ht 68.0 in | Wt 215.0 lb

## 2017-07-28 DIAGNOSIS — Z1211 Encounter for screening for malignant neoplasm of colon: Secondary | ICD-10-CM

## 2017-07-28 DIAGNOSIS — Z1212 Encounter for screening for malignant neoplasm of rectum: Secondary | ICD-10-CM

## 2017-07-28 MED ORDER — SODIUM CHLORIDE 0.9 % IV SOLN: 500.0000 mL | INTRAVENOUS | Status: DC

## 2017-07-28 NOTE — Progress Notes (Signed)
Pt's states no medical or surgical changes since previsit or office visit. 

## 2017-07-28 NOTE — Patient Instructions (Addendum)
 No polyps or cancer seen. You do have diverticulosis - thickened muscle rings and pouches in the colon wall. Please read the handout about this condition.  Next routine colonoscopy or other screening test in 10 years - 2028  I appreciate the opportunity to care for you. Blanca Carreon E. Katelyn Broadnax, MD, FACG   Discharge instructions given. Handout on diverticulosis. Resume previous medications. YOU HAD AN ENDOSCOPIC PROCEDURE TODAY AT THE [facility]:   Refer to the procedure report that was given to you for any specific questions about what was found during the examination.  If the procedure report does not answer your questions, please call your gastroenterologist to clarify.  If you requested that your care partner not be given the details of your procedure findings, then the procedure report has been included in a sealed envelope for you to review at your convenience later.  YOU SHOULD EXPECT: Some feelings of bloating in the abdomen. Passage of more gas than usual.  Walking can help get rid of the air that was put into your GI tract during the procedure and reduce the bloating. If you had a lower endoscopy (such as a colonoscopy or flexible sigmoidoscopy) you may notice spotting of blood in your stool or on the toilet paper. If you underwent a bowel prep for your procedure, you may not have a normal bowel movement for a few days.  Please Note:  You might notice some irritation and congestion in your nose or some drainage.  This is from the oxygen used during your procedure.  There is no need for concern and it should clear up in a day or so.  SYMPTOMS TO REPORT IMMEDIATELY:   Following lower endoscopy (colonoscopy or flexible sigmoidoscopy):  Excessive amounts of blood in the stool  Significant tenderness or worsening of abdominal pains  Swelling of the abdomen that is new, acute  Fever of 100F or higher  For urgent or emergent issues, a gastroenterologist can be reached at  any hour by calling [phone].   DIET:  We do recommend a small meal at first, but then you may proceed to your regular diet.  Drink plenty of fluids but you should avoid alcoholic beverages for 24 hours.  ACTIVITY:  You should plan to take it easy for the rest of today and you should NOT DRIVE or use heavy machinery until tomorrow (because of the sedation medicines used during the test).    FOLLOW UP: Our staff will call the number listed on your records the next business day following your procedure to check on you and address any questions or concerns that you may have regarding the information given to you following your procedure. If we do not reach you, we will leave a message.  However, if you are feeling well and you are not experiencing any problems, there is no need to return our call.  We will assume that you have returned to your regular daily activities without incident.  If any biopsies were taken you will be contacted by phone or by letter within the next 1-3 weeks.  Please call us at [phone] if you have not heard about the biopsies in 3 weeks.    SIGNATURES/CONFIDENTIALITY: You and/or your care partner have signed paperwork which will be entered into your electronic medical record.  These signatures attest to the fact that that the information above on your After Visit Summary has been reviewed and is understood.  Full responsibility of the confidentiality of this   discharge information lies with you and/or your care-partner.   

## 2017-07-28 NOTE — Op Note (Signed)
[facility] Patient Name: Johnny Daniel Procedure Date: 07/28/2017 8:12 AM MRN: [id number] Endoscopist: Iva Boop , MD Age: 51 Referring MD:  Date of Birth: 1966-08-30 Gender: Male Account #: [id number] Procedure:                Colonoscopy Indications:              Screening for colorectal malignant neoplasm Medicines:                Propofol per Anesthesia, Monitored Anesthesia Care Procedure:                Pre-Anesthesia Assessment:                           - Prior to the procedure, a History and Physical                            was performed, and patient medications and                            allergies were reviewed. The patient's tolerance of                            previous anesthesia was also reviewed. The risks                            and benefits of the procedure and the sedation                            options and risks were discussed with the patient.                            All questions were answered, and informed consent                            was obtained. Prior Anticoagulants: The patient has                            taken no previous anticoagulant or antiplatelet                            agents. ASA Grade Assessment: I - A normal, healthy                            patient. After reviewing the risks and benefits,                            the patient was deemed in satisfactory condition to                            undergo the procedure.                           After obtaining informed consent, the colonoscope  was passed under direct vision. Throughout the                            procedure, the patient's blood pressure, pulse, and                            oxygen saturations were monitored continuously. The                            Colonoscope was introduced through the anus and                            advanced to the the cecum, identified by                            appendiceal  orifice and ileocecal valve. The                            ileocecal valve, appendiceal orifice, and rectum                            were photographed. The quality of the bowel                            preparation was excellent. The bowel preparation                            used was Miralax. Scope In: 8:35:32 AM Scope Out: 8:47:07 AM Scope Withdrawal Time: 0 hours 8 minutes 53 seconds  Total Procedure Duration: 0 hours 11 minutes 35 seconds  Findings:                 The perianal and digital rectal examinations were                            normal. Pertinent negatives include normal prostate                            (size, shape, and consistency).                           Multiple small and large-mouthed diverticula were                            found in the sigmoid colon.                           The exam was otherwise without abnormality on                            direct and retroflexion views. Complications:            No immediate complications. Estimated blood loss:                            None. Estimated Blood Loss:  Estimated blood loss: none. Recommendation:           - Repeat colonoscopy in 10 years for screening                            purposes.                           - Resume previous diet.                           - Continue present medications. Iva Booparl E , MD 07/28/2017 8:50:10 AM This report has been signed electronically.

## 2017-07-29 ENCOUNTER — Telehealth: Payer: Self-pay | Admitting: *Deleted

## 2017-07-29 NOTE — Telephone Encounter (Signed)
Message left

## 2017-07-29 NOTE — Telephone Encounter (Signed)
  Follow up Call-  Call back number 07/28/2017  Post procedure Call Back phone  # [phone]  Permission to leave phone message Yes  Some recent data might be hidden     Lm that we will try him back later today and would like to speak to him if possible.  Bufford SpikesMarie McCraw RN

## 2017-08-07 ENCOUNTER — Encounter: Payer: Self-pay | Admitting: Family Medicine

## 2017-10-25 ENCOUNTER — Other Ambulatory Visit: Payer: Self-pay | Admitting: Family Medicine

## 2018-06-20 ENCOUNTER — Other Ambulatory Visit: Payer: BC Managed Care – PPO

## 2018-06-22 ENCOUNTER — Encounter: Payer: BC Managed Care – PPO | Admitting: Family Medicine

## 2018-06-27 ENCOUNTER — Encounter: Payer: BC Managed Care – PPO | Admitting: Family Medicine

## 2018-07-04 ENCOUNTER — Other Ambulatory Visit: Payer: Self-pay | Admitting: Family Medicine

## 2018-07-04 DIAGNOSIS — R739 Hyperglycemia, unspecified: Secondary | ICD-10-CM

## 2018-07-04 DIAGNOSIS — Z125 Encounter for screening for malignant neoplasm of prostate: Secondary | ICD-10-CM

## 2018-07-04 DIAGNOSIS — E785 Hyperlipidemia, unspecified: Secondary | ICD-10-CM

## 2018-07-05 ENCOUNTER — Other Ambulatory Visit: Payer: BC Managed Care – PPO

## 2018-07-11 ENCOUNTER — Encounter: Payer: BC Managed Care – PPO | Admitting: Family Medicine

## 2018-08-07 ENCOUNTER — Other Ambulatory Visit (INDEPENDENT_AMBULATORY_CARE_PROVIDER_SITE_OTHER): Payer: BC Managed Care – PPO

## 2018-08-07 DIAGNOSIS — R739 Hyperglycemia, unspecified: Secondary | ICD-10-CM

## 2018-08-07 DIAGNOSIS — E785 Hyperlipidemia, unspecified: Secondary | ICD-10-CM | POA: Diagnosis not present

## 2018-08-07 DIAGNOSIS — Z125 Encounter for screening for malignant neoplasm of prostate: Secondary | ICD-10-CM | POA: Diagnosis not present

## 2018-08-07 LAB — COMPREHENSIVE METABOLIC PANEL
ALT: 20 U/L (ref 0–53)
AST: 19 U/L (ref 0–37)
Albumin: 4.3 g/dL (ref 3.5–5.2)
Alkaline Phosphatase: 50 U/L (ref 39–117)
BILIRUBIN TOTAL: 1.5 mg/dL — AB (ref 0.2–1.2)
BUN: 19 mg/dL (ref 6–23)
CO2: 28 meq/L (ref 19–32)
CREATININE: 1.03 mg/dL (ref 0.40–1.50)
Calcium: 9.7 mg/dL (ref 8.4–10.5)
Chloride: 103 mEq/L (ref 96–112)
GFR: 80.41 mL/min (ref >60.00)
Glucose, Bld: 117 mg/dL — ABNORMAL HIGH (ref 70–99)
Potassium: 4.4 mEq/L (ref 3.5–5.1)
SODIUM: 137 meq/L (ref 135–145)
Total Protein: 7.1 g/dL (ref 6.0–8.3)

## 2018-08-07 LAB — HEMOGLOBIN A1C: Hgb A1c MFr Bld: 5.6 % (ref 4.6–6.5)

## 2018-08-07 LAB — LIPID PANEL
Cholesterol: 194 mg/dL (ref 0–200)
HDL: 60.5 mg/dL (ref >39.00)
LDL CALC: 102 mg/dL — AB (ref 0–99)
NonHDL: 133.07
Total CHOL/HDL Ratio: 3
Triglycerides: 153 mg/dL — ABNORMAL HIGH (ref 0.0–149.0)
VLDL: 30.6 mg/dL (ref 0.0–40.0)

## 2018-08-07 LAB — PSA: PSA: 0.96 ng/mL (ref 0.10–4.00)

## 2018-08-14 ENCOUNTER — Ambulatory Visit (INDEPENDENT_AMBULATORY_CARE_PROVIDER_SITE_OTHER): Payer: BC Managed Care – PPO | Admitting: Family Medicine

## 2018-08-14 ENCOUNTER — Encounter: Payer: Self-pay | Admitting: Family Medicine

## 2018-08-14 VITALS — BP 136/88 | HR 59 | Temp 98.4°F | Ht 67.5 in | Wt 215.0 lb

## 2018-08-14 DIAGNOSIS — R739 Hyperglycemia, unspecified: Secondary | ICD-10-CM | POA: Diagnosis not present

## 2018-08-14 DIAGNOSIS — Z23 Encounter for immunization: Secondary | ICD-10-CM | POA: Diagnosis not present

## 2018-08-14 DIAGNOSIS — E669 Obesity, unspecified: Secondary | ICD-10-CM

## 2018-08-14 DIAGNOSIS — E785 Hyperlipidemia, unspecified: Secondary | ICD-10-CM

## 2018-08-14 DIAGNOSIS — Z0000 Encounter for general adult medical examination without abnormal findings: Secondary | ICD-10-CM

## 2018-08-14 DIAGNOSIS — G4733 Obstructive sleep apnea (adult) (pediatric): Secondary | ICD-10-CM | POA: Diagnosis not present

## 2018-08-14 MED ORDER — ASPIRIN 81 MG PO TBEC: 81.0000 mg | DELAYED_RELEASE_TABLET | ORAL | Status: DC

## 2018-08-14 NOTE — Patient Instructions (Addendum)
Change aspirin to Mon/Wed/Friday.  You are doing well today Return as needed or in 1 year for next physical.  Health Maintenance, Male A healthy lifestyle and preventive care is important for your health and wellness. Ask your health care provider about what schedule of regular examinations is right for you. What should I know about weight and diet? Eat a Healthy Diet  Eat plenty of vegetables, fruits, whole grains, low-fat dairy products, and lean protein.  Do not eat a lot of foods high in solid fats, added sugars, or salt.  Maintain a Healthy Weight Regular exercise can help you achieve or maintain a healthy weight. You should:  Do at least 150 minutes of exercise each week. The exercise should increase your heart rate and make you sweat (moderate-intensity exercise).  Do strength-training exercises at least twice a week.  Watch Your Levels of Cholesterol and Blood Lipids  Have your blood tested for lipids and cholesterol every 5 years starting at 52 years of age. If you are at high risk for heart disease, you should start having your blood tested when you are 52 years old. You may need to have your cholesterol levels checked more often if: ? Your lipid or cholesterol levels are high. ? You are older than 52 years of age. ? You are at high risk for heart disease.  What should I know about cancer screening? Many types of cancers can be detected early and may often be prevented. Lung Cancer  You should be screened every year for lung cancer if: ? You are a current smoker who has smoked for at least 30 years. ? You are a former smoker who has quit within the past 15 years.  Talk to your health care provider about your screening options, when you should start screening, and how often you should be screened.  Colorectal Cancer  Routine colorectal cancer screening usually begins at 52 years of age and should be repeated every 5-10 years until you are 52 years old. You may need to  be screened more often if early forms of precancerous polyps or small growths are found. Your health care provider may recommend screening at an earlier age if you have risk factors for colon cancer.  Your health care provider may recommend using home test kits to check for hidden blood in the stool.  A small camera at the end of a tube can be used to examine your colon (sigmoidoscopy or colonoscopy). This checks for the earliest forms of colorectal cancer.  Prostate and Testicular Cancer  Depending on your age and overall health, your health care provider may do certain tests to screen for prostate and testicular cancer.  Talk to your health care provider about any symptoms or concerns you have about testicular or prostate cancer.  Skin Cancer  Check your skin from head to toe regularly.  Tell your health care provider about any new moles or changes in moles, especially if: ? There is a change in a mole's size, shape, or color. ? You have a mole that is larger than a pencil eraser.  Always use sunscreen. Apply sunscreen liberally and repeat throughout the day.  Protect yourself by wearing long sleeves, pants, a wide-brimmed hat, and sunglasses when outside.  What should I know about heart disease, diabetes, and high blood pressure?  If you are 4718-52 years of age, have your blood pressure checked every 3-5 years. If you are 52 years of age or older, have your blood pressure checked  every year. You should have your blood pressure measured twice-once when you are at a hospital or clinic, and once when you are not at a hospital or clinic. Record the average of the two measurements. To check your blood pressure when you are not at a hospital or clinic, you can use: ? An automated blood pressure machine at a pharmacy. ? A home blood pressure monitor.  Talk to your health care provider about your target blood pressure.  If you are between 93-77 years old, ask your health care provider if  you should take aspirin to prevent heart disease.  Have regular diabetes screenings by checking your fasting blood sugar level. ? If you are at a normal weight and have a low risk for diabetes, have this test once every three years after the age of 43. ? If you are overweight and have a high risk for diabetes, consider being tested at a younger age or more often.  A one-time screening for abdominal aortic aneurysm (AAA) by ultrasound is recommended for men aged 40-75 years who are current or former smokers. What should I know about preventing infection? Hepatitis B If you have a higher risk for hepatitis B, you should be screened for this virus. Talk with your health care provider to find out if you are at risk for hepatitis B infection. Hepatitis C Blood testing is recommended for:  Everyone born from [birth year] through 1965.  Anyone with known risk factors for hepatitis C.  Sexually Transmitted Diseases (STDs)  You should be screened each year for STDs including gonorrhea and chlamydia if: ? You are sexually active and are younger than 52 years of age. ? You are older than 52 years of age and your health care provider tells you that you are at risk for this type of infection. ? Your sexual activity has changed since you were last screened and you are at an increased risk for chlamydia or gonorrhea. Ask your health care provider if you are at risk.  Talk with your health care provider about whether you are at high risk of being infected with HIV. Your health care provider may recommend a prescription medicine to help prevent HIV infection.  What else can I do?  Schedule regular health, dental, and eye exams.  Stay current with your vaccines (immunizations).  Do not use any tobacco products, such as cigarettes, chewing tobacco, and e-cigarettes. If you need help quitting, ask your health care provider.  Limit alcohol intake to no more than 2 drinks per day. One drink equals 12 ounces of  beer, 5 ounces of wine, or 1 ounces of hard liquor.  Do not use street drugs.  Do not share needles.  Ask your health care provider for help if you need support or information about quitting drugs.  Tell your health care provider if you often feel depressed.  Tell your health care provider if you have ever been abused or do not feel safe at home. This information is not intended to replace advice given to you by your health care provider. Make sure you discuss any questions you have with your health care provider. Document Released: 05/13/2008 Document Revised: 07/14/2016 Document Reviewed: 08/19/2015 Elsevier Interactive Patient Education  [employer].

## 2018-08-14 NOTE — Assessment & Plan Note (Signed)
Preventative protocols reviewed and updated unless pt declined. Discussed healthy diet and lifestyle.  

## 2018-08-14 NOTE — Assessment & Plan Note (Signed)
Improved with weight loss 

## 2018-08-14 NOTE — Assessment & Plan Note (Signed)
cbg elevated for fasting reading, but A1c stable. Check fructosamine level next labwork.

## 2018-08-14 NOTE — Progress Notes (Addendum)
BP 136/88 (BP Location: Right Arm, Cuff Size: Normal)   Pulse (!) 59   Temp 98.4 F (36.9 C) (Oral)   Ht 5' 7.5" (1.715 m)   Wt 215 lb (97.5 kg)   SpO2 97%   BMI 33.18 kg/m    CC: CPE Subjective:    Patient ID: Johnny Daniel, male    DOB: 05-08-1966, 52 y.o.   MRN: [id number]  HPI: Johnny Daniel is a 52 y.o. male presenting on 08/14/2018 for Annual Exam   Magnesium helps his sleep.  On aspirin 81mg  daily.   Preventative: COLONOSCOPY 06/2017 - WNL, diverticulosis, rpt 10 yrs Leone Payor([facility]).  Prostate cancer screening - discussed. Would like yearly screening.  Flu shot yearly.  Td 2010.  Seat belt use discussed.  Sunscreen use discussed. No changing moles on skin.  Ex smoker - 2PY hx. Strong passive smoking history (father). 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 Diet: good water, fruits/vegetables daily, low carb diet   Relevant past medical, surgical, family and social history reviewed and updated as indicated. Interim medical history since our last visit reviewed. Allergies and medications reviewed and updated. Outpatient Medications Prior to Visit  Medication Sig Dispense Refill  . GARLIC PO Take 1 capsule by mouth 2 (two) times daily at 8 am and 10 pm.    . Magnesium 500 MG TABS Take 1 tablet by mouth daily.    . rosuvastatin (CRESTOR) 20 MG tablet Take 1 tablet (20 mg total) by mouth daily. 90 tablet 3  . Turmeric 500 MG CAPS Take 1 capsule by mouth 2 (two) times daily.     Marland Kitchen. aspirin 81 MG EC tablet Take 81 mg by mouth daily. Swallow whole.    . rosuvastatin (CRESTOR) 40 MG tablet TAKE 1 TABLET(40 MG) BY MOUTH DAILY 90 tablet 2  . Cinnamon 500 MG capsule Take 500 mg by mouth daily.    Marland Kitchen.  ibuprofen (ADVIL,MOTRIN) 800 MG tablet Take 1 tablet by mouth 3 (three) times daily as needed.     No facility-administered medications prior to visit.      Per HPI unless specifically indicated in ROS section below Review of Systems  Constitutional: Negative for activity change, appetite change, chills, fatigue, fever and unexpected weight change.  HENT: Positive for postnasal drip and sore throat (? allergy related). Negative for hearing loss.   Eyes: Negative for visual disturbance.  Respiratory: Negative for cough, chest tightness, shortness of breath and wheezing.   Cardiovascular: Negative for chest pain, palpitations and leg swelling.  Gastrointestinal: Negative for abdominal distention, abdominal pain, blood in stool, constipation, diarrhea, nausea and vomiting.  Genitourinary: Negative for difficulty urinating and hematuria.  Musculoskeletal: Negative for arthralgias, myalgias and neck pain.  Skin: Negative for rash.  Neurological: Negative for dizziness, seizures, syncope and headaches.  Hematological: Negative for adenopathy. Does not bruise/bleed easily.  Psychiatric/Behavioral: Negative for dysphoric mood. The patient is not nervous/anxious.        Objective:    BP 136/88 (BP Location: Right Arm, Cuff Size: Normal)   Pulse (!) 59   Temp 98.4 F (36.9 C) (Oral)   Ht 5' 7.5" (1.715 m)   Wt 215 lb (97.5 kg)   SpO2 97%  BMI 33.18 kg/m   Wt Readings from Last 3 Encounters:  08/14/18 215 lb (97.5 kg)  07/28/17 215 lb (97.5 kg)  07/14/17 215 lb (97.5 kg)    BP Readings from Last 3 Encounters:  08/14/18 136/88  07/28/17 (!) 118/58  06/21/17 132/84    Physical Exam  Constitutional: He is oriented to person, place, and time. He appears well-developed and well-nourished. No distress.  HENT:  Head: Normocephalic and atraumatic.  Right Ear: Hearing, tympanic membrane, external ear and ear canal normal.  Left Ear: Hearing, tympanic membrane, external ear and ear canal  normal.  Nose: Nose normal.  Mouth/Throat: Uvula is midline, oropharynx is clear and moist and mucous membranes are normal. No oropharyngeal exudate, posterior oropharyngeal edema or posterior oropharyngeal erythema.  Eyes: Pupils are equal, round, and reactive to light. Conjunctivae and EOM are normal. No scleral icterus.  Neck: Normal range of motion. Neck supple. No thyromegaly present.  Cardiovascular: Normal rate, regular rhythm, normal heart sounds and intact distal pulses.  No murmur heard. Pulses:      Radial pulses are 2+ on the right side, and 2+ on the left side.  Pulmonary/Chest: Effort normal and breath sounds normal. No respiratory distress. He has no wheezes. He has no rales.  Abdominal: Soft. Bowel sounds are normal. He exhibits no distension and no mass. There is no tenderness. There is no rebound and no guarding.  Genitourinary: Rectum normal and prostate normal. Rectal exam shows no external hemorrhoid, no internal hemorrhoid, no fissure, no mass, no tenderness and anal tone normal. Prostate is not enlarged (20gm) and not tender.  Musculoskeletal: Normal range of motion. He exhibits no edema.  Lymphadenopathy:    He has no cervical adenopathy.  Neurological: He is alert and oriented to person, place, and time.  CN grossly intact, station and gait intact  Skin: Skin is warm and dry. No rash noted.  Psychiatric: He has a normal mood and affect. His behavior is normal. Judgment and thought content normal.  Nursing note and vitals reviewed.  Results for orders placed or performed in visit on 08/07/18  PSA  Result Value Ref Range   PSA 0.96 0.10 - 4.00 ng/mL  Hemoglobin A1c  Result Value Ref Range   Hgb A1c MFr Bld 5.6 4.6 - 6.5 %  Comprehensive metabolic panel  Result Value Ref Range   Sodium 137 135 - 145 mEq/L   Potassium 4.4 3.5 - 5.1 mEq/L   Chloride 103 96 - 112 mEq/L   CO2 28 19 - 32 mEq/L   Glucose, Bld 117 (H) 70 - 99 mg/dL   BUN 19 6 - 23 mg/dL    Creatinine, Ser 1.61 0.40 - 1.50 mg/dL   Total Bilirubin 1.5 (H) 0.2 - 1.2 mg/dL   Alkaline Phosphatase 50 39 - 117 U/L   AST 19 0 - 37 U/L   ALT 20 0 - 53 U/L   Total Protein 7.1 6.0 - 8.3 g/dL   Albumin 4.3 3.5 - 5.2 g/dL   Calcium 9.7 8.4 - 09.6 mg/dL   GFR 04.54 >09.81 mL/min  Lipid panel  Result Value Ref Range   Cholesterol 194 0 - 200 mg/dL   Triglycerides 191.4 (H) 0.0 - 149.0 mg/dL   HDL 78.29 >56.21 mg/dL   VLDL 30.8 0.0 - 65.7 mg/dL   LDL Cholesterol 846 (H) 0 - 99 mg/dL   Total CHOL/HDL Ratio 3    NonHDL 133.07       Assessment & Plan:  Problem List Items Addressed This Visit    OSA (obstructive sleep apnea)    Improved with weight loss.       Obesity, Class I, BMI 30-34.9    Encouraged healthy diet and lifestyle changes to affect sustainable weight loss. He has regular exercise routine, reviewed healthy diet.      Hyperglycemia    cbg elevated for fasting reading, but A1c stable. Check fructosamine level next labwork.      HLD (hyperlipidemia)    Chronic, stable on crestor. Levels trending up as crestor dosing has been lowered.  The 10-year ASCVD risk score Denman George DC Montez Hageman., et al., 2013) is: 3.9%   Values used to calculate the score:     Age: 58 years     Sex: Male     Is Non-Hispanic African American: No     Diabetic: No     Tobacco smoker: No     Systolic Blood Pressure: 140 mmHg     Is BP treated: No     HDL Cholesterol: 60.5 mg/dL     Total Cholesterol: 194 mg/dL       Relevant Medications   aspirin 81 MG EC tablet   Health maintenance examination - Primary    Preventative protocols reviewed and updated unless pt declined. Discussed healthy diet and lifestyle.        Other Visit Diagnoses    Need for influenza vaccination       Relevant Orders   Flu Vaccine QUAD 36+ mos IM (Completed)       Meds ordered this encounter  Medications  . aspirin 81 MG EC tablet    Sig: Take 1 tablet (81 mg total) by mouth every Monday, Wednesday, and  Friday. Swallow whole.   Orders Placed This Encounter  Procedures  . Flu Vaccine QUAD 36+ mos IM    Follow up plan: Return in about 1 year (around 08/15/2019) for annual exam, prior fasting for blood work.  Eustaquio Boyden, MD

## 2018-08-14 NOTE — Assessment & Plan Note (Signed)
Chronic, stable on crestor. Levels trending up as crestor dosing has been lowered.  The 10-year ASCVD risk score Denman George(Goff DC Montez HagemanJr., et al., 2013) is: 3.9%   Values used to calculate the score:     Age: [age] years     Sex: Male     Is Non-Hispanic African American: No     Diabetic: No     Tobacco smoker: No     Systolic Blood Pressure: 140 mmHg     Is BP treated: No     HDL Cholesterol: 60.5 mg/dL     Total Cholesterol: 194 mg/dL

## 2018-08-14 NOTE — Assessment & Plan Note (Signed)
Encouraged healthy diet and lifestyle changes to affect sustainable weight loss. He has regular exercise routine, reviewed healthy diet.

## 2018-11-21 ENCOUNTER — Other Ambulatory Visit: Payer: Self-pay | Admitting: Family Medicine

## 2018-11-23 ENCOUNTER — Telehealth: Payer: Self-pay | Admitting: Family Medicine

## 2018-11-23 MED ORDER — ROSUVASTATIN CALCIUM 20 MG PO TABS: 20.0000 mg | ORAL_TABLET | Freq: Every day | ORAL | 3 refills | Status: DC

## 2018-11-23 NOTE — Telephone Encounter (Signed)
Explained the refill request denied due to incorrect strength.  Dr. Reece AgarG had decreased pt to 20 mg from 40mg . Pt verbalizes understanding and asks that refill for 20 mg be sent to pharmacy.  E-scribed refill.  Pt aware.

## 2018-11-23 NOTE — Telephone Encounter (Signed)
Pt want to know why his medication for Rosuvastatin wasn't approved. Please advise pt.

## 2019-08-16 ENCOUNTER — Other Ambulatory Visit: Payer: BC Managed Care – PPO

## 2019-08-22 ENCOUNTER — Encounter: Payer: BC Managed Care – PPO | Admitting: Family Medicine

## 2019-09-10 ENCOUNTER — Telehealth: Payer: Self-pay

## 2019-09-10 NOTE — Telephone Encounter (Signed)
LVM w COVID screen, back lab and front door info  

## 2019-09-12 ENCOUNTER — Other Ambulatory Visit: Payer: Self-pay

## 2019-09-12 ENCOUNTER — Other Ambulatory Visit (INDEPENDENT_AMBULATORY_CARE_PROVIDER_SITE_OTHER): Payer: BC Managed Care – PPO

## 2019-09-12 ENCOUNTER — Other Ambulatory Visit: Payer: Self-pay | Admitting: Family Medicine

## 2019-09-12 DIAGNOSIS — Z125 Encounter for screening for malignant neoplasm of prostate: Secondary | ICD-10-CM

## 2019-09-12 DIAGNOSIS — R739 Hyperglycemia, unspecified: Secondary | ICD-10-CM

## 2019-09-12 DIAGNOSIS — E785 Hyperlipidemia, unspecified: Secondary | ICD-10-CM

## 2019-09-12 LAB — COMPREHENSIVE METABOLIC PANEL
ALT: 22 U/L (ref 0–53)
AST: 18 U/L (ref 0–37)
Albumin: 4.4 g/dL (ref 3.5–5.2)
Alkaline Phosphatase: 57 U/L (ref 39–117)
BUN: 15 mg/dL (ref 6–23)
CO2: 28 mEq/L (ref 19–32)
Calcium: 9.7 mg/dL (ref 8.4–10.5)
Chloride: 103 mEq/L (ref 96–112)
Creatinine, Ser: 0.95 mg/dL (ref 0.40–1.50)
GFR: 82.7 mL/min (ref >60.00)
Glucose, Bld: 105 mg/dL — ABNORMAL HIGH (ref 70–99)
Potassium: 4.3 mEq/L (ref 3.5–5.1)
Sodium: 139 mEq/L (ref 135–145)
Total Bilirubin: 1.1 mg/dL (ref 0.2–1.2)
Total Protein: 7.2 g/dL (ref 6.0–8.3)

## 2019-09-12 LAB — LIPID PANEL
Cholesterol: 177 mg/dL (ref 0–200)
HDL: 52.3 mg/dL (ref >39.00)
LDL Cholesterol: 100 mg/dL — ABNORMAL HIGH (ref 0–99)
NonHDL: 124.85
Total CHOL/HDL Ratio: 3
Triglycerides: 124 mg/dL (ref 0.0–149.0)
VLDL: 24.8 mg/dL (ref 0.0–40.0)

## 2019-09-12 LAB — PSA: PSA: 0.86 ng/mL (ref 0.10–4.00)

## 2019-09-12 LAB — HEMOGLOBIN A1C: Hgb A1c MFr Bld: 5.8 % (ref 4.6–6.5)

## 2019-09-14 ENCOUNTER — Other Ambulatory Visit: Payer: Self-pay

## 2019-09-14 ENCOUNTER — Ambulatory Visit (INDEPENDENT_AMBULATORY_CARE_PROVIDER_SITE_OTHER): Payer: BC Managed Care – PPO | Admitting: Family Medicine

## 2019-09-14 ENCOUNTER — Ambulatory Visit (INDEPENDENT_AMBULATORY_CARE_PROVIDER_SITE_OTHER)
Admission: RE | Admit: 2019-09-14 | Discharge: 2019-09-14 | Disposition: A | Discharge status: Home / Self Care | Payer: BC Managed Care – PPO | Source: Ambulatory Visit | Attending: Family Medicine | Admitting: Family Medicine

## 2019-09-14 ENCOUNTER — Encounter: Payer: Self-pay | Admitting: Family Medicine

## 2019-09-14 VITALS — BP 122/80 | HR 61 | Temp 97.9°F | Ht 67.25 in | Wt 221.0 lb

## 2019-09-14 DIAGNOSIS — Z23 Encounter for immunization: Secondary | ICD-10-CM

## 2019-09-14 DIAGNOSIS — Z0000 Encounter for general adult medical examination without abnormal findings: Secondary | ICD-10-CM | POA: Diagnosis not present

## 2019-09-14 DIAGNOSIS — R3914 Feeling of incomplete bladder emptying: Secondary | ICD-10-CM

## 2019-09-14 DIAGNOSIS — E7800 Pure hypercholesterolemia, unspecified: Secondary | ICD-10-CM

## 2019-09-14 DIAGNOSIS — R7303 Prediabetes: Secondary | ICD-10-CM | POA: Diagnosis not present

## 2019-09-14 DIAGNOSIS — M89319 Hypertrophy of bone, unspecified shoulder: Secondary | ICD-10-CM

## 2019-09-14 DIAGNOSIS — E669 Obesity, unspecified: Secondary | ICD-10-CM

## 2019-09-14 DIAGNOSIS — I10 Essential (primary) hypertension: Secondary | ICD-10-CM | POA: Insufficient documentation

## 2019-09-14 DIAGNOSIS — R030 Elevated blood-pressure reading, without diagnosis of hypertension: Secondary | ICD-10-CM

## 2019-09-14 DIAGNOSIS — N401 Enlarged prostate with lower urinary tract symptoms: Secondary | ICD-10-CM

## 2019-09-14 DIAGNOSIS — N400 Enlarged prostate without lower urinary tract symptoms: Secondary | ICD-10-CM | POA: Insufficient documentation

## 2019-09-14 MED ORDER — ROSUVASTATIN CALCIUM 20 MG PO TABS: 20.0000 mg | ORAL_TABLET | Freq: Every day | ORAL | 3 refills | Status: DC

## 2019-09-14 MED ORDER — TAMSULOSIN HCL 0.4 MG PO CAPS: 0.4000 mg | ORAL_CAPSULE | Freq: Every day | ORAL | 3 refills | Status: DC

## 2019-09-14 MED ORDER — ST JOHNS WORT 150 MG PO CAPS: 1.0000 | ORAL_CAPSULE | Freq: Three times a day (TID) | ORAL | Status: DC

## 2019-09-14 NOTE — Assessment & Plan Note (Addendum)
More noticeable weakening of stream and possible mild incomplete emptying over the last 6 months. DRE reassuring with mild enlargement. PSA reassuring.  I-PSS = 18-3.  Discussed possible medication for this. Will trial flomax 0.4mg  nightly, update with effect.

## 2019-09-14 NOTE — Assessment & Plan Note (Signed)
Discussed with patient. Encouraged limiting added sugars in diet.

## 2019-09-14 NOTE — Assessment & Plan Note (Signed)
Encouraged healthy diet and lifestyle changes to affect sustainable weight loss.  

## 2019-09-14 NOTE — Assessment & Plan Note (Signed)
Chronic, stable on crestor - continue this. The 10-year ASCVD risk score Mikey Bussing DC Brooke Bonito., et al., 2013) is: 3.5%   Values used to calculate the score:     Age: 53 years     Sex: Male     Is Non-Hispanic African American: No     Diabetic: No     Tobacco smoker: No     Systolic Blood Pressure: 025 mmHg     Is BP treated: No     HDL Cholesterol: 52.3 mg/dL     Total Cholesterol: 177 mg/dL

## 2019-09-14 NOTE — Assessment & Plan Note (Signed)
Left clavicle enlargement, present for at least months. No pain. Will check films.

## 2019-09-14 NOTE — Assessment & Plan Note (Signed)
Preventative protocols reviewed and updated unless pt declined. Discussed healthy diet and lifestyle.  

## 2019-09-14 NOTE — Progress Notes (Signed)
This visit was conducted in person.  BP 122/80 (BP Location: Left Arm, Patient Position: Sitting, Cuff Size: Large)   Pulse 61   Temp 97.9 F (36.6 C) (Temporal)   Ht 5' 7.25" (1.708 m)   Wt 221 lb (100.2 kg)   SpO2 98%   BMI 34.36 kg/m   On my bp check 150/90  CC: CPE Subjective:    Patient ID: Johnny RummageMark Daniel, male    DOB: November 22, 1966, 53 y.o.   MRN: [id number]  HPI: Johnny Daniel is a 53 y.o. 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  Gained 15 lbs since Johnny. Previous to Covid was [facility] with good cardio - not since. Enjoys running but markedly limited by L knee pain.   Preventative: COLONOSCOPY 06/2017 - WNL, diverticulosis, rpt 10 yrs Johnny Daniel(Johnny Daniel).  Prostate cancer screening - discussed. Would like yearly screening. Notes some incomplete emptying and weakening stream without other urinary symptoms. Nocturia x1.  Flu shot yearly. Td 2010.Tdap today.  shingrix - discussed.  Seat belt use discussed. Sunscreen use discussed. No changing moles on skin. Ex smoker - 2PY hx. 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 Interim medical history since our last visit reviewed. Allergies and medications reviewed and updated. Outpatient Medications Prior to Visit  Medication Sig Dispense Refill  . aspirin 81 MG EC tablet Take 1 tablet (81 mg total) by mouth every Monday, Wednesday, and Friday. Swallow whole.    Marland Kitchen. GARLIC PO Take 1 capsule by mouth 2  (two) times daily at 8 am and 10 pm.    . Magnesium 500 MG TABS Take 1 tablet by mouth daily.    . Omega-3 Fatty Acids (FISH OIL) 435 MG CAPS Take 1 capsule by mouth daily.    . Turmeric 500 MG CAPS Take 1 capsule by mouth 2 (two) times daily.     . rosuvastatin (CRESTOR) 20 MG tablet Take 1 tablet (20 mg total) by mouth daily. 90 tablet 3   No facility-administered medications prior to visit.      Per HPI unless specifically indicated in ROS section below Review of Systems  Constitutional: Negative for activity change, appetite change, chills, fatigue, fever and unexpected weight change.  HENT: Negative for hearing loss.   Eyes: Negative for visual disturbance.  Respiratory: Negative for cough, chest tightness, shortness of breath and wheezing.   Cardiovascular: Negative for chest pain, palpitations and leg swelling.  Gastrointestinal: Negative for abdominal distention, abdominal pain, blood in stool, constipation, diarrhea, nausea and vomiting.  Genitourinary: Negative for difficulty urinating and hematuria.  Musculoskeletal: Negative for arthralgias, myalgias and neck pain.  Skin: Negative for rash.  Neurological: Negative for dizziness, seizures, syncope and headaches.  Hematological: Negative for adenopathy. Does not bruise/bleed easily.  Psychiatric/Behavioral: Negative for dysphoric mood. The patient is nervous/anxious (worse with current events).        Tried St John's wort   Objective:    BP 122/80 (BP Location: Left Arm, Patient  Position: Sitting, Cuff Size: Large)   Pulse 61   Temp 97.9 F (36.6 C) (Temporal)   Ht 5' 7.25" (1.708 m)   Wt 221 lb (100.2 kg)   SpO2 98%   BMI 34.36 kg/m   Wt Readings from Last 3 Encounters:  09/14/19 221 lb (100.2 kg)  08/14/18 215 lb (97.5 kg)  07/28/17 215 lb (97.5 kg)    Physical Exam Vitals signs and nursing note reviewed.  Constitutional:      General: He is not in acute distress.    Appearance: Normal appearance. He is  well-developed. He is obese. He is not ill-appearing.  HENT:     Head: Normocephalic and atraumatic.     Right Ear: Hearing, tympanic membrane, ear canal and external ear normal.     Left Ear: Hearing, tympanic membrane, ear canal and external ear normal.     Nose: Nose normal.     Mouth/Throat:     Mouth: Mucous membranes are moist.     Pharynx: Oropharynx is clear. Uvula midline. No oropharyngeal exudate or posterior oropharyngeal erythema.  Eyes:     General: No scleral icterus.    Extraocular Movements: Extraocular movements intact.     Conjunctiva/sclera: Conjunctivae normal.     Pupils: Pupils are equal, round, and reactive to light.  Neck:     Musculoskeletal: Normal range of motion and neck supple.  Cardiovascular:     Rate and Rhythm: Normal rate and regular rhythm.     Pulses: Normal pulses.          Radial pulses are 2+ on the right side and 2+ on the left side.     Heart sounds: Normal heart sounds. No murmur.  Pulmonary:     Effort: Pulmonary effort is normal. No respiratory distress.     Breath sounds: Normal breath sounds. No wheezing, rhonchi or rales.  Chest:     Chest wall: Swelling ( L clavicle head enlargement, nontender) present. No tenderness.    Abdominal:     General: Abdomen is flat. Bowel sounds are normal. There is no distension.     Palpations: Abdomen is soft. There is no mass.     Tenderness: There is no abdominal tenderness. There is no guarding or rebound.     Hernia: No hernia is present.  Genitourinary:    Prostate: Enlarged (30gm). Not tender and no nodules present.     Rectum: Normal. No mass, tenderness, anal fissure, external hemorrhoid or internal hemorrhoid. Normal anal tone.  Musculoskeletal: Normal range of motion.  Lymphadenopathy:     Cervical: No cervical adenopathy.  Skin:    General: Skin is warm and dry.     Findings: No rash.  Neurological:     General: No focal deficit present.     Mental Status: He is alert and oriented to  person, place, and time.     Comments: CN grossly intact, station and gait intact  Psychiatric:        Mood and Affect: Mood normal.        Behavior: Behavior normal.        Thought Content: Thought content normal.        Judgment: Judgment normal.       Results for orders placed or performed in visit on 09/12/19  PSA  Result Value Ref Range   PSA 0.86 0.10 - 4.00 ng/mL  Hemoglobin A1c  Result Value Ref Range   Hgb A1c MFr Bld 5.8 4.6 - 6.5 %  Comprehensive metabolic panel  Result Value Ref Range   Sodium 139 135 - 145 mEq/L   Potassium 4.3 3.5 - 5.1 mEq/L   Chloride 103 96 - 112 mEq/L   CO2 28 19 - 32 mEq/L   Glucose, Bld 105 (H) 70 - 99 mg/dL   BUN 15 6 - 23 mg/dL   Creatinine, Ser 0.98 0.40 - 1.50 mg/dL   Total Bilirubin 1.1 0.2 - 1.2 mg/dL   Alkaline Phosphatase 57 39 - 117 U/L   AST 18 0 - 37 U/L   ALT 22 0 - 53 U/L   Total Protein 7.2 6.0 - 8.3 g/dL   Albumin 4.4 3.5 - 5.2 g/dL   Calcium 9.7 8.4 - 11.9 mg/dL   GFR 14.78 >29.56 mL/min  Lipid panel  Result Value Ref Range   Cholesterol 177 0 - 200 mg/dL   Triglycerides 213.0 0.0 - 149.0 mg/dL   HDL 86.57 >84.69 mg/dL   VLDL 62.9 0.0 - 52.8 mg/dL   LDL Cholesterol 413 (H) 0 - 99 mg/dL   Total CHOL/HDL Ratio 3    NonHDL 124.85    Assessment & Plan:   Problem List Items Addressed This Visit    Prediabetes    Discussed with patient. Encouraged limiting added sugars in diet.       Obesity, Class I, BMI 30-34.9    Encouraged healthy diet and lifestyle changes to affect sustainable weight loss.       HLD (hyperlipidemia)    Chronic, stable on crestor - continue this. The 10-year ASCVD risk score Denman George DC Montez Hageman., et al., 2013) is: 3.5%   Values used to calculate the score:     Age: 89 years     Sex: Male     Is Non-Hispanic African American: No     Diabetic: No     Tobacco smoker: No     Systolic Blood Pressure: 122 mmHg     Is BP treated: No     HDL Cholesterol: 52.3 mg/dL     Total Cholesterol: 177 mg/dL        Relevant Medications   rosuvastatin (CRESTOR) 20 MG tablet   Health maintenance examination - Primary    Preventative protocols reviewed and updated unless pt declined. Discussed healthy diet and lifestyle.       Elevated blood pressure reading in office without diagnosis of hypertension    Endorses home reading also elevated. Reviewed protocol to check blood pressure at home - he will monitor and let me know if persistently >140/90 to discuss medication.       Clavicle enlargement    Left clavicle enlargement, present for at least months. No pain. Will check films.       Relevant Orders   DG Clavicle Left   BPH (benign prostatic hyperplasia)    More noticeable weakening of stream and possible mild incomplete emptying over the last 6 months. DRE reassuring with mild enlargement. PSA reassuring.  I-PSS = 18-3.  Discussed possible medication for this. Will trial flomax 0.4mg  nightly, update with effect.       Relevant Medications   tamsulosin (FLOMAX) 0.4 MG CAPS capsule    Other Visit Diagnoses    Need for influenza vaccination       Relevant Orders   Flu Vaccine QUAD 36+ mos IM (Completed)   Need for Tdap vaccination       Relevant Orders   Tdap vaccine greater than or equal to 7yo IM (Completed)  Meds ordered this encounter  Medications  . rosuvastatin (CRESTOR) 20 MG tablet    Sig: Take 1 tablet (20 mg total) by mouth daily.    Dispense:  90 tablet    Refill:  3  . St Johns Wort 150 MG CAPS    Sig: Take 1 capsule (150 mg total) by mouth 3 (three) times daily.  . tamsulosin (FLOMAX) 0.4 MG CAPS capsule    Sig: Take 1 capsule (0.4 mg total) by mouth daily after supper.    Dispense:  30 capsule    Refill:  3   Orders Placed This Encounter  Procedures  . DG Clavicle Left    Standing Status:   Future    Number of Occurrences:   1    Standing Expiration Date:   11/13/2020    Order Specific Question:   Reason for Exam (SYMPTOM  OR DIAGNOSIS REQUIRED)     Answer:   nontender L clavicle head enlargement x months    Order Specific Question:   Preferred imaging location?    Answer:   [facility]    Order Specific Question:   Radiology Contrast Protocol - do NOT remove file path    Answer:   [email or web address]  . Flu Vaccine QUAD 36+ mos IM  . Tdap vaccine greater than or equal to 7yo IM    Follow up plan: Return in about 1 year (around 09/13/2020) for annual exam, prior fasting for blood work.  Eustaquio Boyden, MD

## 2019-09-14 NOTE — Patient Instructions (Addendum)
Flu shot today Tdap today Continue crestor Clavicle xray today Check with insurance to see if shingrix series is covered.  Return as needed or in 1 year for next physical.  Health Maintenance, Male Adopting a healthy lifestyle and getting preventive care are important in promoting health and wellness. Ask your health care provider about:  The right schedule for you to have regular tests and exams.  Things you can do on your own to prevent diseases and keep yourself healthy. What should I know about diet, weight, and exercise? Eat a healthy diet   Eat a diet that includes plenty of vegetables, fruits, low-fat dairy products, and lean protein.  Do not eat a lot of foods that are high in solid fats, added sugars, or sodium. Maintain a healthy weight Body mass index (BMI) is a measurement that can be used to identify possible weight problems. It estimates body fat based on height and weight. Your health care provider can help determine your BMI and help you achieve or maintain a healthy weight. Get regular exercise Get regular exercise. This is one of the most important things you can do for your health. Most adults should:  Exercise for at least 150 minutes each week. The exercise should increase your heart rate and make you sweat (moderate-intensity exercise).  Do strengthening exercises at least twice a week. This is in addition to the moderate-intensity exercise.  Spend less time sitting. Even light physical activity can be beneficial. Watch cholesterol and blood lipids Have your blood tested for lipids and cholesterol at 53 years of age, then have this test every 5 years. You may need to have your cholesterol levels checked more often if:  Your lipid or cholesterol levels are high.  You are older than 53 years of age.  You are at high risk for heart disease. What should I know about cancer screening? Many types of cancers can be detected early and may often be prevented.  Depending on your health history and family history, you may need to have cancer screening at various ages. This may include screening for:  Colorectal cancer.  Prostate cancer.  Skin cancer.  Lung cancer. What should I know about heart disease, diabetes, and high blood pressure? Blood pressure and heart disease  High blood pressure causes heart disease and increases the risk of stroke. This is more likely to develop in people who have high blood pressure readings, are of African descent, or are overweight.  Talk with your health care provider about your target blood pressure readings.  Have your blood pressure checked: ? Every 3-5 years if you are 61-11 years of age. ? Every year if you are 19 years old or older.  If you are between the ages of 66 and 47 and are a current or former smoker, ask your health care provider if you should have a one-time screening for abdominal aortic aneurysm (AAA). Diabetes Have regular diabetes screenings. This checks your fasting blood sugar level. Have the screening done:  Once every three years after age 92 if you are at a normal weight and have a low risk for diabetes.  More often and at a younger age if you are overweight or have a high risk for diabetes. What should I know about preventing infection? Hepatitis B If you have a higher risk for hepatitis B, you should be screened for this virus. Talk with your health care provider to find out if you are at risk for hepatitis B infection. Hepatitis C  Blood testing is recommended for:  Everyone born from [birth year] through 1965.  Anyone with known risk factors for hepatitis C. Sexually transmitted infections (STIs)  You should be screened each year for STIs, including gonorrhea and chlamydia, if: ? You are sexually active and are younger than 53 years of age. ? You are older than 53 years of age and your health care provider tells you that you are at risk for this type of infection. ? Your sexual  activity has changed since you were last screened, and you are at increased risk for chlamydia or gonorrhea. Ask your health care provider if you are at risk.  Ask your health care provider about whether you are at high risk for HIV. Your health care provider may recommend a prescription medicine to help prevent HIV infection. If you choose to take medicine to prevent HIV, you should first get tested for HIV. You should then be tested every 3 months for as long as you are taking the medicine. Follow these instructions at home: Lifestyle  Do not use any products that contain nicotine or tobacco, such as cigarettes, e-cigarettes, and chewing tobacco. If you need help quitting, ask your health care provider.  Do not use street drugs.  Do not share needles.  Ask your health care provider for help if you need support or information about quitting drugs. Alcohol use  Do not drink alcohol if your health care provider tells you not to drink.  If you drink alcohol: ? Limit how much you have to 0-2 drinks a day. ? Be aware of how much alcohol is in your drink. In the [place] one drink equals one 12 oz bottle of beer (355 mL), one 5 oz glass of wine (148 mL), or one 1 oz glass of hard liquor (44 mL). General instructions  Schedule regular health, dental, and eye exams.  Stay current with your vaccines.  Tell your health care provider if: ? You often feel depressed. ? You have ever been abused or do not feel safe at home. Summary  Adopting a healthy lifestyle and getting preventive care are important in promoting health and wellness.  Follow your health care provider's instructions about healthy diet, exercising, and getting tested or screened for diseases.  Follow your health care provider's instructions on monitoring your cholesterol and blood pressure. This information is not intended to replace advice given to you by your health care provider. Make sure you discuss any questions you have  with your health care provider. Document Released: 05/13/2008 Document Revised: 11/08/2018 Document Reviewed: 11/08/2018 Elsevier Patient Education  2020 [employer].

## 2019-09-14 NOTE — Assessment & Plan Note (Signed)
Endorses home reading also elevated. Reviewed protocol to check blood pressure at home - he will monitor and let me know if persistently >140/90 to discuss medication.

## 2019-10-14 ENCOUNTER — Encounter: Payer: Self-pay | Admitting: Family Medicine

## 2019-10-15 MED ORDER — LISINOPRIL 5 MG PO TABS: 5.0000 mg | ORAL_TABLET | Freq: Every day | ORAL | 3 refills | Status: DC

## 2019-11-27 ENCOUNTER — Encounter: Payer: Self-pay | Admitting: Family Medicine

## 2020-01-20 ENCOUNTER — Other Ambulatory Visit: Payer: Self-pay | Admitting: Family Medicine

## 2020-05-30 ENCOUNTER — Other Ambulatory Visit: Payer: Self-pay | Admitting: Family Medicine

## 2020-07-18 ENCOUNTER — Other Ambulatory Visit: Payer: Self-pay | Admitting: Family Medicine

## 2020-09-04 ENCOUNTER — Other Ambulatory Visit: Payer: Self-pay | Admitting: Family Medicine

## 2020-09-04 DIAGNOSIS — R7303 Prediabetes: Secondary | ICD-10-CM

## 2020-09-04 DIAGNOSIS — N401 Enlarged prostate with lower urinary tract symptoms: Secondary | ICD-10-CM

## 2020-09-04 DIAGNOSIS — E7800 Pure hypercholesterolemia, unspecified: Secondary | ICD-10-CM

## 2020-09-12 ENCOUNTER — Other Ambulatory Visit: Payer: Self-pay

## 2020-09-12 ENCOUNTER — Other Ambulatory Visit (INDEPENDENT_AMBULATORY_CARE_PROVIDER_SITE_OTHER): Payer: BC Managed Care – PPO

## 2020-09-12 DIAGNOSIS — N401 Enlarged prostate with lower urinary tract symptoms: Secondary | ICD-10-CM | POA: Diagnosis not present

## 2020-09-12 DIAGNOSIS — E7800 Pure hypercholesterolemia, unspecified: Secondary | ICD-10-CM | POA: Diagnosis not present

## 2020-09-12 DIAGNOSIS — R7303 Prediabetes: Secondary | ICD-10-CM | POA: Diagnosis not present

## 2020-09-12 DIAGNOSIS — R3914 Feeling of incomplete bladder emptying: Secondary | ICD-10-CM

## 2020-09-12 LAB — LIPID PANEL
Cholesterol: 168 mg/dL (ref 0–200)
HDL: 53 mg/dL (ref >39.00)
LDL Cholesterol: 103 mg/dL — ABNORMAL HIGH (ref 0–99)
NonHDL: 114.7
Total CHOL/HDL Ratio: 3
Triglycerides: 58 mg/dL (ref 0.0–149.0)
VLDL: 11.6 mg/dL (ref 0.0–40.0)

## 2020-09-12 LAB — PSA: PSA: 0.7 ng/mL (ref 0.10–4.00)

## 2020-09-12 LAB — HEMOGLOBIN A1C: Hgb A1c MFr Bld: 6 % (ref 4.6–6.5)

## 2020-09-12 LAB — COMPREHENSIVE METABOLIC PANEL
ALT: 20 U/L (ref 0–53)
AST: 18 U/L (ref 0–37)
Albumin: 4.4 g/dL (ref 3.5–5.2)
Alkaline Phosphatase: 51 U/L (ref 39–117)
BUN: 17 mg/dL (ref 6–23)
CO2: 27 mEq/L (ref 19–32)
Calcium: 9.5 mg/dL (ref 8.4–10.5)
Chloride: 102 mEq/L (ref 96–112)
Creatinine, Ser: 0.94 mg/dL (ref 0.40–1.50)
GFR: 91.08 mL/min (ref >60.00)
Glucose, Bld: 110 mg/dL — ABNORMAL HIGH (ref 70–99)
Potassium: 4.2 mEq/L (ref 3.5–5.1)
Sodium: 136 mEq/L (ref 135–145)
Total Bilirubin: 1.3 mg/dL — ABNORMAL HIGH (ref 0.2–1.2)
Total Protein: 7.2 g/dL (ref 6.0–8.3)

## 2020-09-19 ENCOUNTER — Encounter: Payer: BC Managed Care – PPO | Admitting: Family Medicine

## 2020-10-06 ENCOUNTER — Encounter: Payer: Self-pay | Admitting: Family Medicine

## 2020-10-06 ENCOUNTER — Ambulatory Visit: Payer: BC Managed Care – PPO | Admitting: Family Medicine

## 2020-10-06 ENCOUNTER — Other Ambulatory Visit: Payer: Self-pay

## 2020-10-06 VITALS — BP 122/80 | HR 67 | Temp 98.3°F | Ht 67.25 in | Wt 213.1 lb

## 2020-10-06 DIAGNOSIS — E7800 Pure hypercholesterolemia, unspecified: Secondary | ICD-10-CM | POA: Diagnosis not present

## 2020-10-06 DIAGNOSIS — Z23 Encounter for immunization: Secondary | ICD-10-CM

## 2020-10-06 DIAGNOSIS — E669 Obesity, unspecified: Secondary | ICD-10-CM

## 2020-10-06 DIAGNOSIS — R3914 Feeling of incomplete bladder emptying: Secondary | ICD-10-CM

## 2020-10-06 DIAGNOSIS — E66811 Obesity, class 1: Secondary | ICD-10-CM

## 2020-10-06 DIAGNOSIS — N401 Enlarged prostate with lower urinary tract symptoms: Secondary | ICD-10-CM

## 2020-10-06 DIAGNOSIS — R030 Elevated blood-pressure reading, without diagnosis of hypertension: Secondary | ICD-10-CM

## 2020-10-06 DIAGNOSIS — Z0000 Encounter for general adult medical examination without abnormal findings: Secondary | ICD-10-CM | POA: Diagnosis not present

## 2020-10-06 DIAGNOSIS — R7303 Prediabetes: Secondary | ICD-10-CM

## 2020-10-06 DIAGNOSIS — B351 Tinea unguium: Secondary | ICD-10-CM

## 2020-10-06 MED ORDER — TAMSULOSIN HCL 0.4 MG PO CAPS: 0.4000 mg | ORAL_CAPSULE | Freq: Every day | ORAL | 3 refills | Status: DC

## 2020-10-06 MED ORDER — ROSUVASTATIN CALCIUM 20 MG PO TABS: 20.0000 mg | ORAL_TABLET | Freq: Every day | ORAL | 3 refills | Status: DC

## 2020-10-06 MED ORDER — LISINOPRIL 5 MG PO TABS: 5.0000 mg | ORAL_TABLET | Freq: Every day | ORAL | 3 refills | Status: DC

## 2020-10-06 MED ORDER — TERBINAFINE HCL 250 MG PO TABS: 250.0000 mg | ORAL_TABLET | Freq: Every day | ORAL | 0 refills | Status: DC

## 2020-10-06 NOTE — Assessment & Plan Note (Signed)
Encouraged healthy diet and lifestyle changes to affect sustainable weight loss.  

## 2020-10-06 NOTE — Assessment & Plan Note (Signed)
Chronic, stable on crestor.  The 10-year ASCVD risk score Denman George DC Montez Hageman., et al., 2013) is: 3.6%   Values used to calculate the score:     Age: 54 years     Sex: Male     Is Non-Hispanic African American: No     Diabetic: No     Tobacco smoker: No     Systolic Blood Pressure: 122 mmHg     Is BP treated: No     HDL Cholesterol: 53 mg/dL     Total Cholesterol: 168 mg/dL

## 2020-10-06 NOTE — Assessment & Plan Note (Signed)
Stable period on flomax - pt receiving benefit form this medication.

## 2020-10-06 NOTE — Progress Notes (Signed)
This visit was conducted in person.  BP 122/80 (BP Location: Left Arm, Patient Position: Sitting, Cuff Size: Large)   Pulse 67   Temp 98.3 F (36.8 C) (Temporal)   Ht 5' 7.25" (1.708 m)   Wt 213 lb 2 oz (96.7 kg)   SpO2 95%   BMI 33.13 kg/m    CC: CPE Subjective:    Patient ID: Johnny Daniel, male    DOB: 1966/06/29, 54 y.o.   MRN: [id number]  HPI: Johnny Daniel is a 54 y.o. male presenting on 10/06/2020 for Annual Exam   Educator- back in classrooms  8 lb weight loss since last year.   Longstanding issue with L great toenail, L 3rd and 4th toenails with discoloration concern for fungal infection. Right foot unaffected. Funginail daily for >1 yr has not helped.   Preventative: COLONOSCOPY 06/2017-WNL, diverticulosis, rpt 10 yrs Leone Payor([facility]). Prostate cancer screening - discussed. Yearly screening. Notes some incomplete emptying and weakening stream without other urinary symptoms. No nocturia.  Lung cancer screening - not eligible  Flu shot yearly. Td 2010.Tdap 08/2019 COVID vaccine Pfizer 11/2019, 01/2020, 07/2020 shingrix - discussed. interested  Seat belt use discussed. Sunscreen use discussed. No changing moles on skin. Ex smoker - 2PY hx. 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 Interim medical history since our last visit reviewed. Allergies and medications reviewed and updated. Outpatient Medications Prior to Visit  Medication Sig Dispense Refill  . GARLIC PO  Take 1 capsule by mouth 2 (two) times daily at 8 am and 10 pm.    . Magnesium 500 MG TABS Take 1 tablet by mouth daily.    . Turmeric 500 MG CAPS Take 1 capsule by mouth 2 (two) times daily.     Marland Kitchen. lisinopril (ZESTRIL) 5 MG tablet TAKE 1 TABLET BY MOUTH DAILY 90 tablet 0  . rosuvastatin (CRESTOR) 20 MG tablet Take 1 tablet (20 mg total) by mouth daily. 90 tablet 3  . tamsulosin (FLOMAX) 0.4 MG CAPS capsule TAKE 1 CAPSULE(0.4 MG) BY MOUTH DAILY AFTER SUPPER 30 capsule 3  . aspirin 81 MG EC tablet Take 1 tablet (81 mg total) by mouth every Monday, Wednesday, and Friday. Swallow whole. (Patient not taking: Reported on 10/06/2020)    . Omega-3 Fatty Acids (FISH OIL) 435 MG CAPS Take 1 capsule by mouth daily. (Patient not taking: Reported on 10/06/2020)    . St Johns Wort 150 MG CAPS Take 1 capsule (150 mg total) by mouth 3 (three) times daily.     No facility-administered medications prior to visit.     Per HPI unless specifically indicated in ROS section below Review of Systems  Constitutional: Negative for activity change, appetite change, chills, fatigue, fever and unexpected weight change.  HENT: Negative for hearing loss.   Eyes: Negative for visual disturbance.  Respiratory: Negative for cough, chest tightness, shortness of breath and wheezing.   Cardiovascular: Negative for chest pain, palpitations and leg swelling.  Gastrointestinal: Negative for abdominal distention, abdominal pain, blood in stool, constipation, diarrhea, nausea and  vomiting.  Genitourinary: Negative for difficulty urinating and hematuria.  Musculoskeletal: Negative for arthralgias, myalgias and neck pain.  Skin: Negative for rash.  Neurological: Negative for dizziness, seizures, syncope and headaches.  Hematological: Negative for adenopathy. Does not bruise/bleed easily.  Psychiatric/Behavioral: Negative for dysphoric mood. The patient is nervous/anxious (covid related).    Objective:  BP 122/80 (BP Location: Left  Arm, Patient Position: Sitting, Cuff Size: Large)   Pulse 67   Temp 98.3 F (36.8 C) (Temporal)   Ht 5' 7.25" (1.708 m)   Wt 213 lb 2 oz (96.7 kg)   SpO2 95%   BMI 33.13 kg/m   Wt Readings from Last 3 Encounters:  10/06/20 213 lb 2 oz (96.7 kg)  09/14/19 221 lb (100.2 kg)  08/14/18 215 lb (97.5 kg)      Physical Exam Vitals and nursing note reviewed.  Constitutional:      General: He is not in acute distress.    Appearance: Normal appearance. He is well-developed. He is not ill-appearing.  HENT:     Head: Normocephalic and atraumatic.     Right Ear: Hearing, tympanic membrane, ear canal and external ear normal.     Left Ear: Hearing, tympanic membrane, ear canal and external ear normal.  Eyes:     General: No scleral icterus.    Extraocular Movements: Extraocular movements intact.     Conjunctiva/sclera: Conjunctivae normal.     Pupils: Pupils are equal, round, and reactive to light.  Neck:     Thyroid: No thyroid mass or thyromegaly.  Cardiovascular:     Rate and Rhythm: Normal rate and regular rhythm.     Pulses: Normal pulses.          Radial pulses are 2+ on the right side and 2+ on the left side.     Heart sounds: Normal heart sounds. No murmur heard.   Pulmonary:     Effort: Pulmonary effort is normal. No respiratory distress.     Breath sounds: Normal breath sounds. No wheezing, rhonchi or rales.  Abdominal:     General: Abdomen is flat. Bowel sounds are normal. There is no distension.     Palpations: Abdomen is soft. There is no mass.     Tenderness: There is no abdominal tenderness. There is no guarding or rebound.     Hernia: No hernia is present.  Musculoskeletal:        General: Normal range of motion.     Cervical back: Normal range of motion and neck supple.     Right lower leg: No edema.     Left lower leg: No edema.  Lymphadenopathy:     Cervical: No cervical adenopathy.  Skin:    General: Skin is warm and dry.     Comments: L toenails 1st,3rd,4th  toes with mildly thickened yellow nails without pain or itching  Neurological:     General: No focal deficit present.     Mental Status: He is alert and oriented to person, place, and time.     Comments: CN grossly intact, station and gait intact  Psychiatric:        Mood and Affect: Mood normal.        Behavior: Behavior normal.        Thought Content: Thought content normal.        Judgment: Judgment normal.       Results for orders placed or performed in visit on 09/12/20  PSA  Result Value Ref Range  PSA 0.70 0.10 - 4.00 ng/mL  Hemoglobin A1c  Result Value Ref Range   Hgb A1c MFr Bld 6.0 4.6 - 6.5 %  Comprehensive metabolic panel  Result Value Ref Range   Sodium 136 135 - 145 mEq/L   Potassium 4.2 3.5 - 5.1 mEq/L   Chloride 102 96 - 112 mEq/L   CO2 27 19 - 32 mEq/L   Glucose, Bld 110 (H) 70 - 99 mg/dL   BUN 17 6 - 23 mg/dL   Creatinine, Ser 6.30 0.40 - 1.50 mg/dL   Total Bilirubin 1.3 (H) 0.2 - 1.2 mg/dL   Alkaline Phosphatase 51 39 - 117 U/L   AST 18 0 - 37 U/L   ALT 20 0 - 53 U/L   Total Protein 7.2 6.0 - 8.3 g/dL   Albumin 4.4 3.5 - 5.2 g/dL   GFR 16.01 >09.32 mL/min   Calcium 9.5 8.4 - 10.5 mg/dL  Lipid panel  Result Value Ref Range   Cholesterol 168 0 - 200 mg/dL   Triglycerides 35.5 0 - 149 mg/dL   HDL 73.22 >02.54 mg/dL   VLDL 27.0 0.0 - 62.3 mg/dL   LDL Cholesterol 762 (H) 0 - 99 mg/dL   Total CHOL/HDL Ratio 3    NonHDL 114.70    Assessment & Plan:  This visit occurred during the SARS-CoV-2 public health emergency.  Safety protocols were in place, including screening questions prior to the visit, additional usage of staff PPE, and extensive cleaning of exam room while observing appropriate contact time as indicated for disinfecting solutions.   Problem List Items Addressed This Visit    Prediabetes    Encouraged limiting added sugars in diet.       Onychomycosis    Longstanding issue - not resolved with topical funginail over the past 1-2 yrs    Will Rx terbinafine 250mg  daily x 6 wks, update with effect.      Relevant Medications   terbinafine (LAMISIL) 250 MG tablet   Obesity, Class I, BMI 30-34.9    Encouraged healthy diet and lifestyle changes to affect sustainable weight loss.       HLD (hyperlipidemia)    Chronic, stable on crestor.  The 10-year ASCVD risk score DC Denman George., et al., 2013) is: 3.6%   Values used to calculate the score:     Age: 9 years     Sex: Male     Is Non-Hispanic African American: No     Diabetic: No     Tobacco smoker: No     Systolic Blood Pressure: 122 mmHg     Is BP treated: No     HDL Cholesterol: 53 mg/dL     Total Cholesterol: 168 mg/dL       Relevant Medications   rosuvastatin (CRESTOR) 20 MG tablet   lisinopril (ZESTRIL) 5 MG tablet   Health maintenance examination - Primary    Preventative protocols reviewed and updated unless pt declined. Discussed healthy diet and lifestyle.       Elevated blood pressure reading in office without diagnosis of hypertension    Continue low dose lisinopril 5mg  daily. Tolerating well.       BPH (benign prostatic hyperplasia)    Stable period on flomax - pt receiving benefit form this medication.       Relevant Medications   tamsulosin (FLOMAX) 0.4 MG CAPS capsule    Other Visit Diagnoses    Need for influenza vaccination       Relevant Orders  Flu Vaccine QUAD 36+ mos IM (Completed)   Need for shingles vaccine       Relevant Orders   Varicella-zoster vaccine IM (Completed)       Meds ordered this encounter  Medications  . tamsulosin (FLOMAX) 0.4 MG CAPS capsule    Sig: Take 1 capsule (0.4 mg total) by mouth daily after supper.    Dispense:  90 capsule    Refill:  3  . rosuvastatin (CRESTOR) 20 MG tablet    Sig: Take 1 tablet (20 mg total) by mouth daily.    Dispense:  90 tablet    Refill:  3  . lisinopril (ZESTRIL) 5 MG tablet    Sig: Take 1 tablet (5 mg total) by mouth daily.    Dispense:  90 tablet    Refill:  3  .  terbinafine (LAMISIL) 250 MG tablet    Sig: Take 1 tablet (250 mg total) by mouth daily.    Dispense:  45 tablet    Refill:  0   Orders Placed This Encounter  Procedures  . Flu Vaccine QUAD 36+ mos IM  . Varicella-zoster vaccine IM    Patient instructions: Flu shot today  Shingrix vaccine today. Return in 2-6 months to complete shingrix series (nurse visit)  Increase fiber in the diet. Watch added sugars, decrease alcohol intake. This will all help keep sugars under control.  Return as needed or in 1 year for next physical .  Follow up plan: Return in about 1 year (around 10/06/2021) for annual exam, prior fasting for blood work.  Eustaquio Boyden, MD

## 2020-10-06 NOTE — Assessment & Plan Note (Signed)
Preventative protocols reviewed and updated unless pt declined. Discussed healthy diet and lifestyle.  

## 2020-10-06 NOTE — Assessment & Plan Note (Signed)
Longstanding issue - not resolved with topical funginail over the past 1-2 yrs  Will Rx terbinafine 250mg  daily x 6 wks, update with effect.

## 2020-10-06 NOTE — Assessment & Plan Note (Signed)
Continue low dose lisinopril 5mg  daily. Tolerating well.

## 2020-10-06 NOTE — Patient Instructions (Addendum)
Flu shot today  Shingrix vaccine today. Return in 2-6 months to complete shingrix series (nurse visit)  Increase fiber in the diet. Watch added sugars, decrease alcohol intake. This will all help keep sugars under control.  Return as needed or in 1 year for next physical.   Health Maintenance, Male Adopting a healthy lifestyle and getting preventive care are important in promoting health and wellness. Ask your health care provider about:  The right schedule for you to have regular tests and exams.  Things you can do on your own to prevent diseases and keep yourself healthy. What should I know about diet, weight, and exercise? Eat a healthy diet   Eat a diet that includes plenty of vegetables, fruits, low-fat dairy products, and lean protein.  Do not eat a lot of foods that are high in solid fats, added sugars, or sodium. Maintain a healthy weight Body mass index (BMI) is a measurement that can be used to identify possible weight problems. It estimates body fat based on height and weight. Your health care provider can help determine your BMI and help you achieve or maintain a healthy weight. Get regular exercise Get regular exercise. This is one of the most important things you can do for your health. Most adults should:  Exercise for at least 150 minutes each week. The exercise should increase your heart rate and make you sweat (moderate-intensity exercise).  Do strengthening exercises at least twice a week. This is in addition to the moderate-intensity exercise.  Spend less time sitting. Even light physical activity can be beneficial. Watch cholesterol and blood lipids Have your blood tested for lipids and cholesterol at 54 years of age, then have this test every 5 years. You may need to have your cholesterol levels checked more often if:  Your lipid or cholesterol levels are high.  You are older than 54 years of age.  You are at high risk for heart disease. What should I know  about cancer screening? Many types of cancers can be detected early and may often be prevented. Depending on your health history and family history, you may need to have cancer screening at various ages. This may include screening for:  Colorectal cancer.  Prostate cancer.  Skin cancer.  Lung cancer. What should I know about heart disease, diabetes, and high blood pressure? Blood pressure and heart disease  High blood pressure causes heart disease and increases the risk of stroke. This is more likely to develop in people who have high blood pressure readings, are of African descent, or are overweight.  Talk with your health care provider about your target blood pressure readings.  Have your blood pressure checked: ? Every 3-5 years if you are 18-65 years of age. ? Every year if you are 40 years old or older.  If you are between the ages of 63 and 56 and are a current or former smoker, ask your health care provider if you should have a one-time screening for abdominal aortic aneurysm (AAA). Diabetes Have regular diabetes screenings. This checks your fasting blood sugar level. Have the screening done:  Once every three years after age 45 if you are at a normal weight and have a low risk for diabetes.  More often and at a younger age if you are overweight or have a high risk for diabetes. What should I know about preventing infection? Hepatitis B If you have a higher risk for hepatitis B, you should be screened for this virus. Talk with  your health care provider to find out if you are at risk for hepatitis B infection. Hepatitis C Blood testing is recommended for:  Everyone born from [birth year] through 1965.  Anyone with known risk factors for hepatitis C. Sexually transmitted infections (STIs)  You should be screened each year for STIs, including gonorrhea and chlamydia, if: ? You are sexually active and are younger than 54 years of age. ? You are older than 54 years of age and your  health care provider tells you that you are at risk for this type of infection. ? Your sexual activity has changed since you were last screened, and you are at increased risk for chlamydia or gonorrhea. Ask your health care provider if you are at risk.  Ask your health care provider about whether you are at high risk for HIV. Your health care provider may recommend a prescription medicine to help prevent HIV infection. If you choose to take medicine to prevent HIV, you should first get tested for HIV. You should then be tested every 3 months for as long as you are taking the medicine. Follow these instructions at home: Lifestyle  Do not use any products that contain nicotine or tobacco, such as cigarettes, e-cigarettes, and chewing tobacco. If you need help quitting, ask your health care provider.  Do not use street drugs.  Do not share needles.  Ask your health care provider for help if you need support or information about quitting drugs. Alcohol use  Do not drink alcohol if your health care provider tells you not to drink.  If you drink alcohol: ? Limit how much you have to 0-2 drinks a day. ? Be aware of how much alcohol is in your drink. In the [place] one drink equals one 12 oz bottle of beer (355 mL), one 5 oz glass of wine (148 mL), or one 1 oz glass of hard liquor (44 mL). General instructions  Schedule regular health, dental, and eye exams.  Stay current with your vaccines.  Tell your health care provider if: ? You often feel depressed. ? You have ever been abused or do not feel safe at home. Summary  Adopting a healthy lifestyle and getting preventive care are important in promoting health and wellness.  Follow your health care provider's instructions about healthy diet, exercising, and getting tested or screened for diseases.  Follow your health care provider's instructions on monitoring your cholesterol and blood pressure. This information is not intended to replace  advice given to you by your health care provider. Make sure you discuss any questions you have with your health care provider. Document Revised: 11/08/2018 Document Reviewed: 11/08/2018 Elsevier Patient Education  2020 [employer].

## 2020-10-06 NOTE — Assessment & Plan Note (Signed)
Encouraged limiting added sugars in diet.  

## 2020-11-24 ENCOUNTER — Other Ambulatory Visit: Payer: Self-pay | Admitting: Family Medicine

## 2020-11-24 DIAGNOSIS — B351 Tinea unguium: Secondary | ICD-10-CM

## 2020-11-24 NOTE — Telephone Encounter (Signed)
Pt states he still has fungas on his toes and wants to extend the rx. Asking for refill.

## 2020-11-28 MED ORDER — TERBINAFINE HCL 250 MG PO TABS: 250.0000 mg | ORAL_TABLET | Freq: Every day | ORAL | 0 refills | Status: DC

## 2020-11-28 NOTE — Telephone Encounter (Signed)
Has lamisil helped at all? I have sent another Rx for lamisil but would like him to come in for labs to check liver after initial lamisil course. Labs ordered

## 2021-02-02 ENCOUNTER — Encounter: Payer: Self-pay | Admitting: Family Medicine

## 2021-02-25 ENCOUNTER — Encounter: Payer: Self-pay | Admitting: Family Medicine

## 2021-02-25 ENCOUNTER — Other Ambulatory Visit: Payer: Self-pay

## 2021-02-25 ENCOUNTER — Ambulatory Visit (INDEPENDENT_AMBULATORY_CARE_PROVIDER_SITE_OTHER)
Admission: RE | Admit: 2021-02-25 | Discharge: 2021-02-25 | Disposition: A | Discharge status: Home / Self Care | Payer: BC Managed Care – PPO | Source: Ambulatory Visit | Attending: Family Medicine | Admitting: Family Medicine

## 2021-02-25 ENCOUNTER — Ambulatory Visit (INDEPENDENT_AMBULATORY_CARE_PROVIDER_SITE_OTHER): Payer: BC Managed Care – PPO | Admitting: Family Medicine

## 2021-02-25 VITALS — BP 134/80 | HR 71 | Temp 97.9°F | Ht 67.25 in | Wt 219.0 lb

## 2021-02-25 DIAGNOSIS — R030 Elevated blood-pressure reading, without diagnosis of hypertension: Secondary | ICD-10-CM

## 2021-02-25 DIAGNOSIS — H9319 Tinnitus, unspecified ear: Secondary | ICD-10-CM | POA: Insufficient documentation

## 2021-02-25 DIAGNOSIS — M5412 Radiculopathy, cervical region: Secondary | ICD-10-CM | POA: Diagnosis not present

## 2021-02-25 DIAGNOSIS — H9313 Tinnitus, bilateral: Secondary | ICD-10-CM | POA: Diagnosis not present

## 2021-02-25 MED ORDER — PREDNISONE 20 MG PO TABS: ORAL_TABLET | ORAL | 0 refills | Status: DC

## 2021-02-25 MED ORDER — GABAPENTIN 300 MG PO CAPS: 300.0000 mg | ORAL_CAPSULE | Freq: Two times a day (BID) | ORAL | 1 refills | Status: DC

## 2021-02-25 NOTE — Progress Notes (Signed)
Patient ID: Johnny Daniel, male    DOB: [birth year], 55 y.o.   MRN: [id number]  This visit was conducted in person.  BP 134/80   Pulse 71   Temp 97.9 F (36.6 C) (Temporal)   Ht 5' 7.25" (1.708 m)   Wt 219 lb (99.3 kg)   SpO2 99%   BMI 34.05 kg/m    CC: back pain, neck pain, tinnitus Subjective:   HPI: Johnny Daniel is a 55 y.o. male presenting on 02/25/2021 for Back Pain (C/o mid upper back pain between shoulder blades.  Also, c/o numbness down left arm when looking down and to the left. Started 1-2 mos ago.  H/o back pain.) and Tinnitus (C/o ringing in bilateral ears, usually at night.  Has had issue previously. )   1-2 mo h/o neck and thoracic back discomfort. Started as strong neck pain that has since improved, but notes persistent neurological symptom of L arm numbness and paresthesias down posterior arm and lateral forearm into palm of hand, reproduced when looking down and to left. L hand palm can itch as well.  No shooting pain down arms or weakness of arms. He continues doing push ups ok.  Ibuprofen 400mg  qhs helps.  Painful to sleep on right side, sometimes on back.  Denies inciting trauma/injury or falls.   H/o R lumbar radiculopathy 1996, found to have shortened pedicles. He had steroid injections into lower back with benefit.   Tinnitus present for last few weeks, worse at night time. He is on ACEI and has been using NSAIDs.     Relevant past medical, surgical, family and social history reviewed and updated as indicated. Interim medical history since our last visit reviewed. Allergies and medications reviewed and updated. Outpatient Medications Prior to Visit  Medication Sig Dispense Refill  . GARLIC PO Take 1 capsule by mouth 2 (two) times daily at 8 am and 10 pm.    . lisinopril (ZESTRIL) 5 MG tablet Take 1 tablet (5 mg total) by mouth daily. 90 tablet 3  . Magnesium 500 MG TABS Take 1 tablet by mouth daily.    . rosuvastatin (CRESTOR) 20 MG tablet Take 1 tablet  (20 mg total) by mouth daily. 90 tablet 3  . tamsulosin (FLOMAX) 0.4 MG CAPS capsule Take 1 capsule (0.4 mg total) by mouth daily after supper. 90 capsule 3  . terbinafine (LAMISIL) 250 MG tablet Take 1 tablet (250 mg total) by mouth daily. 45 tablet 0  . Turmeric 500 MG CAPS Take 1 capsule by mouth 2 (two) times daily.      No facility-administered medications prior to visit.     Per HPI unless specifically indicated in ROS section below Review of Systems Objective:  BP 134/80   Pulse 71   Temp 97.9 F (36.6 C) (Temporal)   Ht 5' 7.25" (1.708 m)   Wt 219 lb (99.3 kg)   SpO2 99%   BMI 34.05 kg/m   Wt Readings from Last 3 Encounters:  02/25/21 219 lb (99.3 kg)  10/06/20 213 lb 2 oz (96.7 kg)  09/14/19 221 lb (100.2 kg)      Physical Exam Vitals and nursing note reviewed.  Constitutional:      Appearance: Normal appearance. He is not ill-appearing.  Musculoskeletal:        General: No swelling or tenderness. Normal range of motion.     Cervical back: Normal range of motion and neck supple. No rigidity or tenderness.     Right lower  leg: No edema.     Left lower leg: No edema.     Comments:  No midline cervical or thoracic spine tenderness No paracervical discomfort to palpation No pain at trapezius or at rhomboids bilaterally  Lymphadenopathy:     Cervical: No cervical adenopathy.  Skin:    General: Skin is warm and dry.     Findings: No rash.  Neurological:     Mental Status: He is alert.     Sensory: Sensation is intact.     Motor: Motor function is intact.     Deep Tendon Reflexes:     Reflex Scores:      Bicep reflexes are 2+ on the right side and 2+ on the left side.    Comments:  5/5 strength BUE Sensation intact BUE Neg spurling    Psychiatric:        Mood and Affect: Mood normal.        Behavior: Behavior normal.       Results for orders placed or performed in visit on 09/12/20  PSA  Result Value Ref Range   PSA 0.70 0.10 - 4.00 ng/mL  Hemoglobin  A1c  Result Value Ref Range   Hgb A1c MFr Bld 6.0 4.6 - 6.5 %  Comprehensive metabolic panel  Result Value Ref Range   Sodium 136 135 - 145 mEq/L   Potassium 4.2 3.5 - 5.1 mEq/L   Chloride 102 96 - 112 mEq/L   CO2 27 19 - 32 mEq/L   Glucose, Bld 110 (H) 70 - 99 mg/dL   BUN 17 6 - 23 mg/dL   Creatinine, Ser 2.99 0.40 - 1.50 mg/dL   Total Bilirubin 1.3 (H) 0.2 - 1.2 mg/dL   Alkaline Phosphatase 51 39 - 117 U/L   AST 18 0 - 37 U/L   ALT 20 0 - 53 U/L   Total Protein 7.2 6.0 - 8.3 g/dL   Albumin 4.4 3.5 - 5.2 g/dL   GFR 24.26 >83.41 mL/min   Calcium 9.5 8.4 - 10.5 mg/dL  Lipid panel  Result Value Ref Range   Cholesterol 168 0 - 200 mg/dL   Triglycerides 96.2 0.0 - 149.0 mg/dL   HDL 22.97 >98.92 mg/dL   VLDL 11.9 0.0 - 41.7 mg/dL   LDL Cholesterol 408 (H) 0 - 99 mg/dL   Total CHOL/HDL Ratio 3    NonHDL 114.70    Assessment & Plan:  This visit occurred during the SARS-CoV-2 public health emergency.  Safety protocols were in place, including screening questions prior to the visit, additional usage of staff PPE, and extensive cleaning of exam room while observing appropriate contact time as indicated for disinfecting solutions.   Problem List Items Addressed This Visit    Elevated blood pressure reading in office without diagnosis of hypertension    BP well controlled with addition of ACEI.  Discussed possible relation of ACEI to tinnitus. Will watch for now.       Left cervical radiculopathy - Primary    Almost 2 month history of persistent L arm numbness with flexion and lateral rotation of neck to the left. Concerning for cervical radiculopathy. Check xrays today. Treat with prednisone taper then gabapentin course. Update if not improving with this to consider more advanced imaging (MR cervical neck) and possible PT. Pt agrees with plan.       Relevant Medications   gabapentin (NEURONTIN) 300 MG capsule   Other Relevant Orders   DG Cervical Spine Complete   Tinnitus  He  has been using more NSAIDs with recent neck issues.  rec back off NSAID, monitor effect of this.           Meds ordered this encounter  Medications  . predniSONE (DELTASONE) 20 MG tablet    Sig: Take two tablets daily for 3 days followed by one tablet daily for 4 days    Dispense:  10 tablet    Refill:  0  . gabapentin (NEURONTIN) 300 MG capsule    Sig: Take 1 capsule (300 mg total) by mouth 2 (two) times daily.    Dispense:  60 capsule    Refill:  1   Orders Placed This Encounter  Procedures  . DG Cervical Spine Complete    Standing Status:   Future    Number of Occurrences:   1    Standing Expiration Date:   02/25/2022    Order Specific Question:   Reason for Exam (SYMPTOM  OR DIAGNOSIS REQUIRED)    Answer:   neck pain with L cervical radiculopathy    Order Specific Question:   Preferred imaging location?    Answer:   [facility]    Patient Instructions  xrays of neck today  Take steroid taper. May try gabapentin nerve pain medicine - take 300mg  at bedtime, ok to slowly titrate to 2-3 times a day if needed.  Heating pad to neck, gentle stretching of neck  Back off anti inflammatories - see if ringing in ears improves If not improving or worsening despite above, let know for MRI of neck.   Follow up plan: Return if symptoms worsen or fail to improve.  [place], MD

## 2021-02-25 NOTE — Assessment & Plan Note (Signed)
He has been using more NSAIDs with recent neck issues.  rec back off NSAID, monitor effect of this.

## 2021-02-25 NOTE — Assessment & Plan Note (Signed)
BP well controlled with addition of ACEI.  Discussed possible relation of ACEI to tinnitus. Will watch for now.

## 2021-02-25 NOTE — Patient Instructions (Signed)
xrays of neck today  Take steroid taper. May try gabapentin nerve pain medicine - take 300mg  at bedtime, ok to slowly titrate to 2-3 times a day if needed.  Heating pad to neck, gentle stretching of neck  Back off anti inflammatories - see if ringing in ears improves If not improving or worsening despite above, let know for MRI of neck.

## 2021-02-25 NOTE — Assessment & Plan Note (Signed)
Almost 2 month history of persistent L arm numbness with flexion and lateral rotation of neck to the left. Concerning for cervical radiculopathy. Check xrays today. Treat with prednisone taper then gabapentin course. Update if not improving with this to consider more advanced imaging (MR cervical neck) and possible PT. Pt agrees with plan.

## 2021-03-02 ENCOUNTER — Other Ambulatory Visit: Payer: Self-pay | Admitting: Family Medicine

## 2021-03-02 DIAGNOSIS — R9389 Abnormal findings on diagnostic imaging of other specified body structures: Secondary | ICD-10-CM

## 2021-03-31 ENCOUNTER — Ambulatory Visit (INDEPENDENT_AMBULATORY_CARE_PROVIDER_SITE_OTHER)
Admission: RE | Admit: 2021-03-31 | Discharge: 2021-03-31 | Disposition: A | Discharge status: Home / Self Care | Payer: BC Managed Care – PPO | Source: Ambulatory Visit | Attending: Family Medicine | Admitting: Family Medicine

## 2021-03-31 ENCOUNTER — Other Ambulatory Visit: Payer: Self-pay

## 2021-03-31 ENCOUNTER — Ambulatory Visit: Payer: BC Managed Care – PPO

## 2021-03-31 DIAGNOSIS — R9389 Abnormal findings on diagnostic imaging of other specified body structures: Secondary | ICD-10-CM

## 2021-04-01 ENCOUNTER — Ambulatory Visit: Payer: BC Managed Care – PPO

## 2021-09-04 IMAGING — DX DG CERVICAL SPINE COMPLETE 4+V
6 series · 6 of 6 positions shown · non-contrast
Comparison: None.

CLINICAL DATA: Neck pain with left cervical radiculopathy.

EXAM:
CERVICAL SPINE - COMPLETE 4+ VIEW

[c-spine lat]
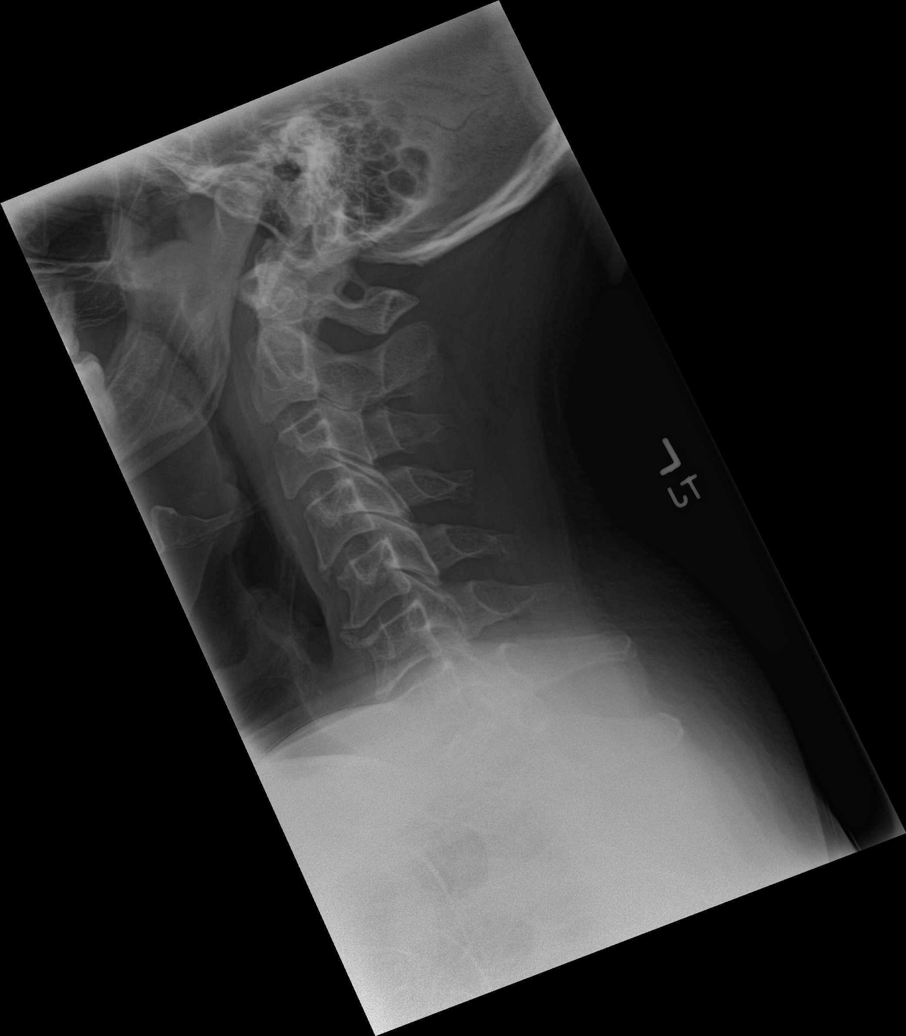

[c-spine obl (1 of 2)]
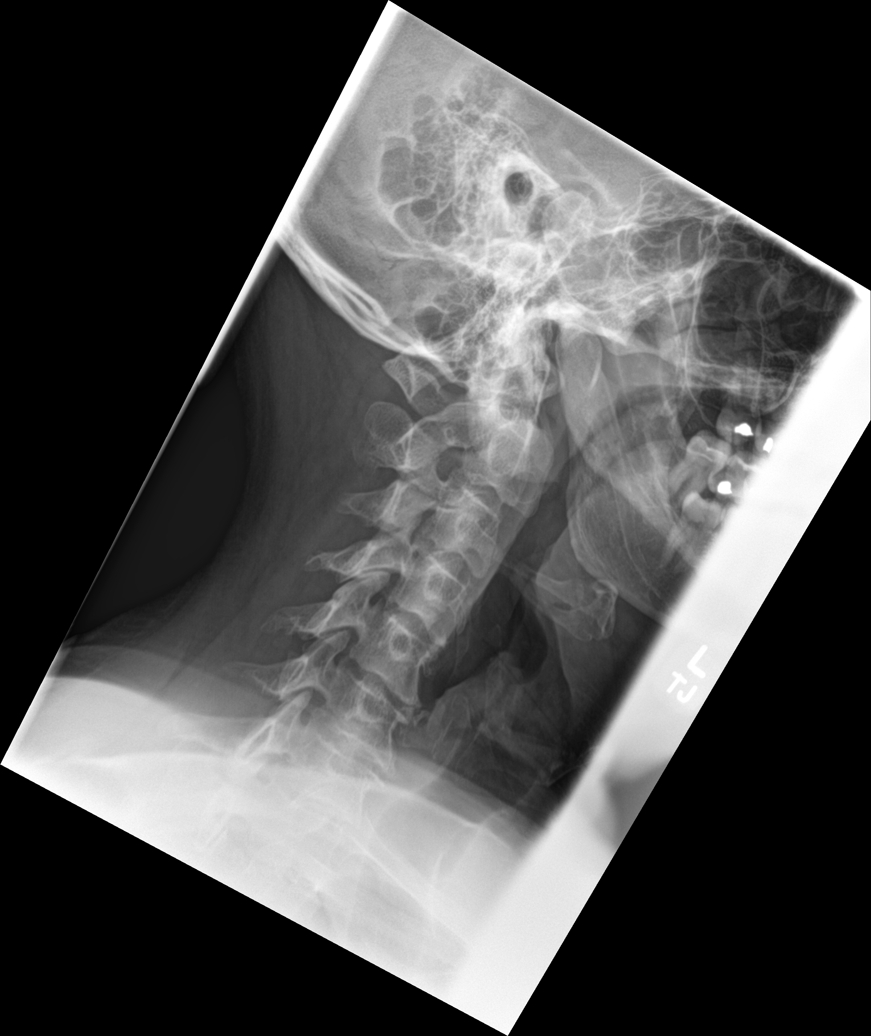

[c-spine obl (2 of 2)]
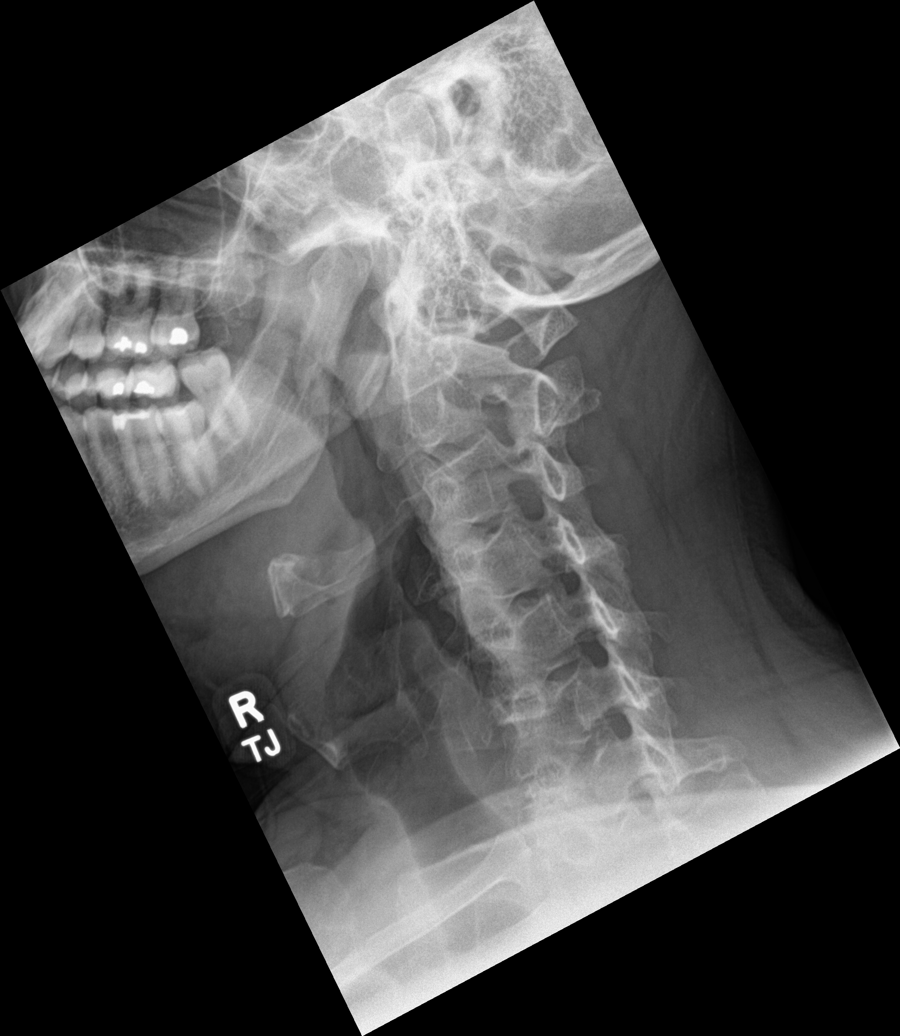

[c-spine ap]
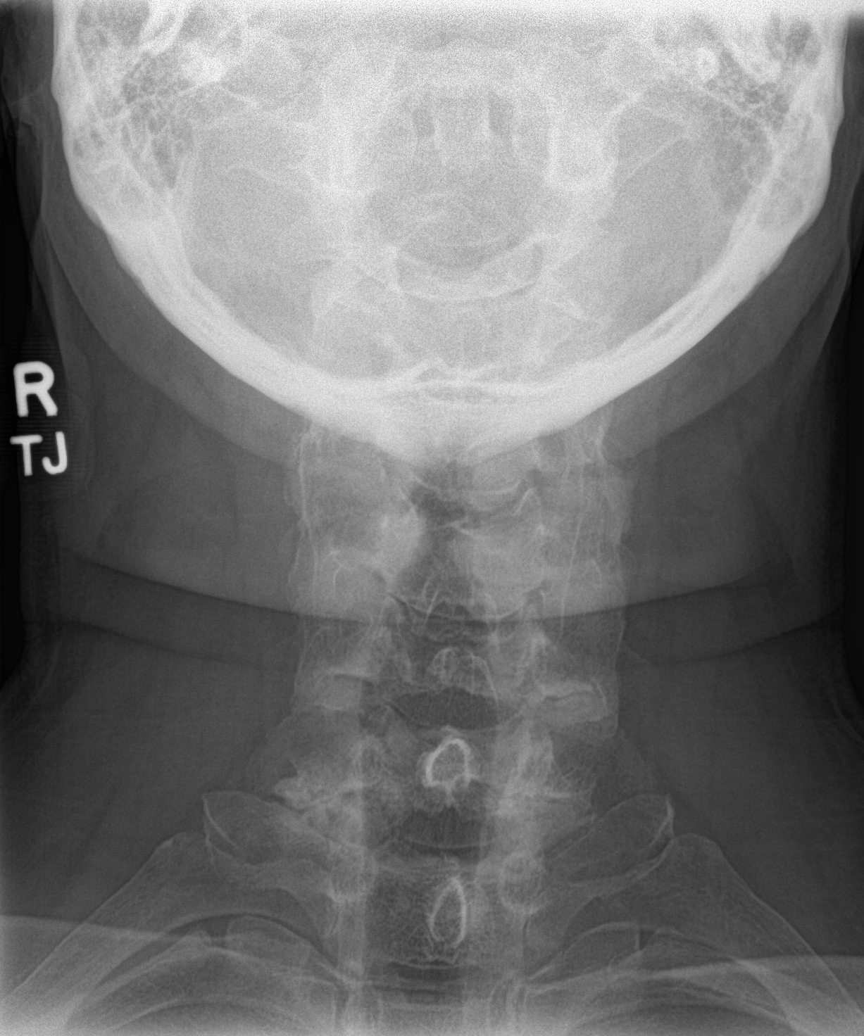

[c-spine open mouth]
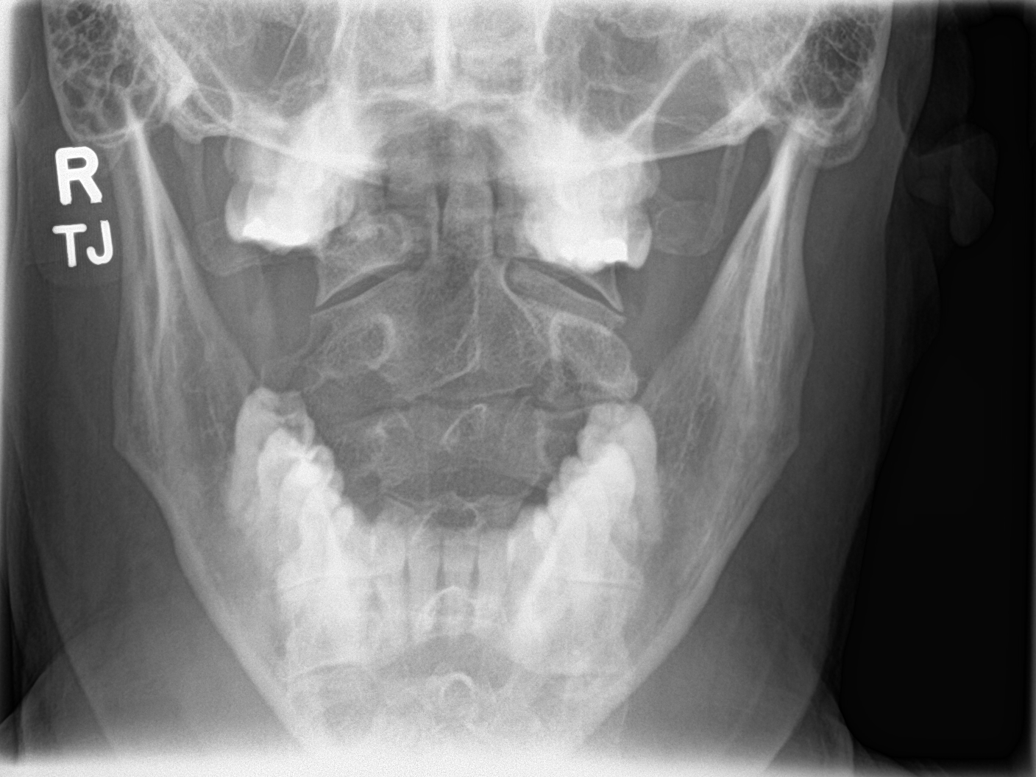

[c-spine swimmers]
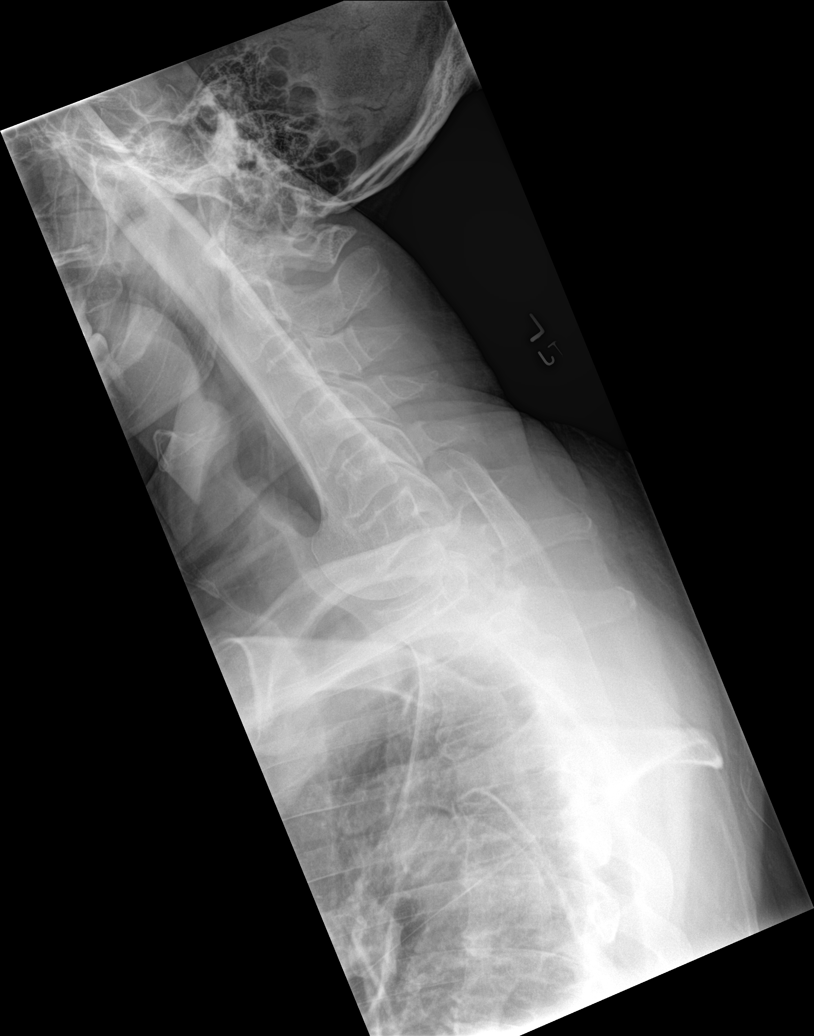

[6 of 6 positions shown; findings below may reference images not displayed]

FINDINGS: Mild disc height loss is noted at the C4-C5 and C5-C6 levels. There
is a mild likely degenerative anterolisthesis of C4 on C5 measuring
approximately 2 mm. There is no acute fracture. There is suggestion
of previously both soft tissue swelling at the C2 level measuring up
to approximately 1.2 cm in with. However, this may be secondary to
patient positioning and overlapping soft tissues given the presence
of an additional linear density at this level. There appears to be
bilateral osseous neural foraminal narrowing in the mid cervical
segments.
IMPRESSION: 1. No acute osseous abnormality.
2. Mild degenerative disc disease at C4-C5 and C5-C6.
3. Possible prevertebral soft tissue swelling at the C2 level. As
this may be projectional, a repeat two-view cervical spine study is
recommended. This can be performed immediately.

## 2021-10-03 ENCOUNTER — Other Ambulatory Visit: Payer: Self-pay | Admitting: Family Medicine

## 2021-10-03 DIAGNOSIS — Z125 Encounter for screening for malignant neoplasm of prostate: Secondary | ICD-10-CM

## 2021-10-03 DIAGNOSIS — Z1159 Encounter for screening for other viral diseases: Secondary | ICD-10-CM

## 2021-10-03 DIAGNOSIS — R7303 Prediabetes: Secondary | ICD-10-CM

## 2021-10-03 DIAGNOSIS — E7800 Pure hypercholesterolemia, unspecified: Secondary | ICD-10-CM

## 2021-10-07 ENCOUNTER — Other Ambulatory Visit: Payer: BC Managed Care – PPO

## 2021-10-14 ENCOUNTER — Other Ambulatory Visit: Payer: Self-pay

## 2021-10-14 ENCOUNTER — Ambulatory Visit (INDEPENDENT_AMBULATORY_CARE_PROVIDER_SITE_OTHER): Payer: BC Managed Care – PPO | Admitting: Family Medicine

## 2021-10-14 ENCOUNTER — Encounter: Payer: Self-pay | Admitting: Family Medicine

## 2021-10-14 VITALS — BP 124/68 | HR 68 | Temp 97.9°F | Ht 67.0 in | Wt 215.2 lb

## 2021-10-14 DIAGNOSIS — E7800 Pure hypercholesterolemia, unspecified: Secondary | ICD-10-CM

## 2021-10-14 DIAGNOSIS — Z23 Encounter for immunization: Secondary | ICD-10-CM

## 2021-10-14 DIAGNOSIS — Z1159 Encounter for screening for other viral diseases: Secondary | ICD-10-CM

## 2021-10-14 DIAGNOSIS — I10 Essential (primary) hypertension: Secondary | ICD-10-CM

## 2021-10-14 DIAGNOSIS — R7303 Prediabetes: Secondary | ICD-10-CM | POA: Diagnosis not present

## 2021-10-14 DIAGNOSIS — Z125 Encounter for screening for malignant neoplasm of prostate: Secondary | ICD-10-CM | POA: Diagnosis not present

## 2021-10-14 DIAGNOSIS — N401 Enlarged prostate with lower urinary tract symptoms: Secondary | ICD-10-CM

## 2021-10-14 DIAGNOSIS — Z0000 Encounter for general adult medical examination without abnormal findings: Secondary | ICD-10-CM | POA: Diagnosis not present

## 2021-10-14 DIAGNOSIS — E669 Obesity, unspecified: Secondary | ICD-10-CM

## 2021-10-14 DIAGNOSIS — B351 Tinea unguium: Secondary | ICD-10-CM

## 2021-10-14 DIAGNOSIS — R3914 Feeling of incomplete bladder emptying: Secondary | ICD-10-CM

## 2021-10-14 MED ORDER — ROSUVASTATIN CALCIUM 20 MG PO TABS: 20.0000 mg | ORAL_TABLET | Freq: Every day | ORAL | 3 refills | Status: DC

## 2021-10-14 MED ORDER — LISINOPRIL 5 MG PO TABS: 5.0000 mg | ORAL_TABLET | Freq: Every day | ORAL | 3 refills | Status: DC

## 2021-10-14 MED ORDER — TAMSULOSIN HCL 0.4 MG PO CAPS: 0.4000 mg | ORAL_CAPSULE | Freq: Every day | ORAL | 3 refills | Status: DC

## 2021-10-14 NOTE — Assessment & Plan Note (Addendum)
Continue flomax. Update PSA

## 2021-10-14 NOTE — Patient Instructions (Addendum)
Labs today Flu shot today  Check on shingrix vaccines - I only have records of you getting 1 at our office.  Good to see you today  Return as needed or in 1 year for next physical.   Health Maintenance, Male Adopting a healthy lifestyle and getting preventive care are important in promoting health and wellness. Ask your health care provider about: The right schedule for you to have regular tests and exams. Things you can do on your own to prevent diseases and keep yourself healthy. What should I know about diet, weight, and exercise? Eat a healthy diet  Eat a diet that includes plenty of vegetables, fruits, low-fat dairy products, and lean protein. Do not eat a lot of foods that are high in solid fats, added sugars, or sodium. Maintain a healthy weight Body mass index (BMI) is a measurement that can be used to identify possible weight problems. It estimates body fat based on height and weight. Your health care provider can help determine your BMI and help you achieve or maintain a healthy weight. Get regular exercise Get regular exercise. This is one of the most important things you can do for your health. Most adults should: Exercise for at least 150 minutes each week. The exercise should increase your heart rate and make you sweat (moderate-intensity exercise). Do strengthening exercises at least twice a week. This is in addition to the moderate-intensity exercise. Spend less time sitting. Even light physical activity can be beneficial. Watch cholesterol and blood lipids Have your blood tested for lipids and cholesterol at 55 years of age, then have this test every 5 years. You may need to have your cholesterol levels checked more often if: Your lipid or cholesterol levels are high. You are older than 55 years of age. You are at high risk for heart disease. What should I know about cancer screening? Many types of cancers can be detected early and may often be prevented. Depending on  your health history and family history, you may need to have cancer screening at various ages. This may include screening for: Colorectal cancer. Prostate cancer. Skin cancer. Lung cancer. What should I know about heart disease, diabetes, and high blood pressure? Blood pressure and heart disease High blood pressure causes heart disease and increases the risk of stroke. This is more likely to develop in people who have high blood pressure readings or are overweight. Talk with your health care provider about your target blood pressure readings. Have your blood pressure checked: Every 3-5 years if you are 27-21 years of age. Every year if you are 48 years old or older. If you are between the ages of 84 and 53 and are a current or former smoker, ask your health care provider if you should have a one-time screening for abdominal aortic aneurysm (AAA). Diabetes Have regular diabetes screenings. This checks your fasting blood sugar level. Have the screening done: Once every three years after age 87 if you are at a normal weight and have a low risk for diabetes. More often and at a younger age if you are overweight or have a high risk for diabetes. What should I know about preventing infection? Hepatitis B If you have a higher risk for hepatitis B, you should be screened for this virus. Talk with your health care provider to find out if you are at risk for hepatitis B infection. Hepatitis C Blood testing is recommended for: Everyone born from [birth year] through 1965. Anyone with known risk factors for  hepatitis C. Sexually transmitted infections (STIs) You should be screened each year for STIs, including gonorrhea and chlamydia, if: You are sexually active and are younger than 55 years of age. You are older than 55 years of age and your health care provider tells you that you are at risk for this type of infection. Your sexual activity has changed since you were last screened, and you are at increased  risk for chlamydia or gonorrhea. Ask your health care provider if you are at risk. Ask your health care provider about whether you are at high risk for HIV. Your health care provider may recommend a prescription medicine to help prevent HIV infection. If you choose to take medicine to prevent HIV, you should first get tested for HIV. You should then be tested every 3 months for as long as you are taking the medicine. Follow these instructions at home: Alcohol use Do not drink alcohol if your health care provider tells you not to drink. If you drink alcohol: Limit how much you have to 0-2 drinks a day. Know how much alcohol is in your drink. In the [place] one drink equals one 12 oz bottle of beer (355 mL), one 5 oz glass of wine (148 mL), or one 1 oz glass of hard liquor (44 mL). Lifestyle Do not use any products that contain nicotine or tobacco. These products include cigarettes, chewing tobacco, and vaping devices, such as e-cigarettes. If you need help quitting, ask your health care provider. Do not use street drugs. Do not share needles. Ask your health care provider for help if you need support or information about quitting drugs. General instructions Schedule regular health, dental, and eye exams. Stay current with your vaccines. Tell your health care provider if: You often feel depressed. You have ever been abused or do not feel safe at home. Summary Adopting a healthy lifestyle and getting preventive care are important in promoting health and wellness. Follow your health care provider's instructions about healthy diet, exercising, and getting tested or screened for diseases. Follow your health care provider's instructions on monitoring your cholesterol and blood pressure. This information is not intended to replace advice given to you by your health care provider. Make sure you discuss any questions you have with your health care provider. Document Revised: 04/06/2021 Document  Reviewed: 04/06/2021 [employer] Patient Education  [place].

## 2021-10-14 NOTE — Progress Notes (Signed)
Patient ID: Johnny Daniel, male    DOB: 16-Aug-1966, 55 y.o.   MRN: [id number]  This visit was conducted in person.  BP 124/68   Pulse 68   Temp 97.9 F (36.6 C) (Temporal)   Ht 5\' 7"  (1.702 m)   Wt 215 lb 3 oz (97.6 kg)   SpO2 98%   BMI 33.70 kg/m    CC: CPE Subjective:   HPI: Johnny Daniel is a 55 y.o. male presenting on 10/14/2021 for Annual Exam   Neck has been feeling well. Has started going back to the gym.    Preventative: COLONOSCOPY 06/2017 - WNL, diverticulosis, rpt 10 yrs Leone Payor).  Prostate cancer screening - discussed. Yearly screening. Nocturia x1.  Lung cancer screening - not eligible  Flu shot yearly.  COVID vaccine Pfizer 11/2019, 01/2020, Pfizer booster 07/2020, Moderna bivalent 08/2021 Td 2010. Tdap 08/2019 Shingrix - 09/2020  Seat belt use discussed.  Sunscreen use discussed. No changing moles on skin.  Ex smoker - 2PY hx, quit 1989. 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 - recently restarted Diet: good water, fruits/vegetables daily, low carb diet      Relevant past medical, surgical, family and social history reviewed and updated as indicated. Interim medical history since our last visit reviewed. Allergies and medications reviewed and updated. Outpatient Medications Prior to Visit  Medication Sig Dispense Refill   GARLIC PO Take 1 capsule by mouth daily.     Omega-3 Fatty Acids (FISH OIL PO) Take by mouth daily.     Turmeric 500 MG CAPS Take 1 capsule by mouth 2 (two) times daily.      lisinopril (ZESTRIL) 5 MG tablet Take 1 tablet (5 mg total) by mouth daily. 90 tablet 3   rosuvastatin (CRESTOR) 20 MG tablet Take 1 tablet (20 mg total) by mouth daily. 90  tablet 3   tamsulosin (FLOMAX) 0.4 MG CAPS capsule Take 1 capsule (0.4 mg total) by mouth daily after supper. 90 capsule 3   terbinafine (LAMISIL) 250 MG tablet Take 1 tablet (250 mg total) by mouth daily. 45 tablet 0   gabapentin (NEURONTIN) 300 MG capsule Take 1 capsule (300 mg total) by mouth 2 (two) times daily. 60 capsule 1   Magnesium 500 MG TABS Take 1 tablet by mouth daily.     predniSONE (DELTASONE) 20 MG tablet Take two tablets daily for 3 days followed by one tablet daily for 4 days 10 tablet 0   No facility-administered medications prior to visit.     Per HPI unless specifically indicated in ROS section below Review of Systems  Constitutional:  Negative for activity change, appetite change, chills, fatigue, fever and unexpected weight change.  HENT:  Negative for hearing loss.   Eyes:  Negative for visual disturbance.  Respiratory:  Positive for cough. Negative for chest tightness, shortness of breath and wheezing.   Cardiovascular:  Negative for chest pain, palpitations and leg swelling.  Gastrointestinal:  Negative for abdominal distention, abdominal pain, blood in stool, constipation, diarrhea, nausea and vomiting.  Genitourinary:  Negative for difficulty urinating and hematuria.  Musculoskeletal:  Negative for arthralgias, myalgias and neck pain.  Skin:  Negative for rash.  Neurological:  Negative for  dizziness, seizures, syncope and headaches.  Hematological:  Negative for adenopathy. Does not bruise/bleed easily.  Psychiatric/Behavioral:  Negative for dysphoric mood. The patient is not nervous/anxious.    Objective:  BP 124/68   Pulse 68   Temp 97.9 F (36.6 C) (Temporal)   Ht 5\' 7"  (1.702 m)   Wt 215 lb 3 oz (97.6 kg)   SpO2 98%   BMI 33.70 kg/m   Wt Readings from Last 3 Encounters:  10/14/21 215 lb 3 oz (97.6 kg)  02/25/21 219 lb (99.3 kg)  10/06/20 213 lb 2 oz (96.7 kg)      Physical Exam Vitals and nursing note reviewed.  Constitutional:      General:  He is not in acute distress.    Appearance: Normal appearance. He is well-developed. He is not ill-appearing.  HENT:     Head: Normocephalic and atraumatic.     Right Ear: Hearing, tympanic membrane, ear canal and external ear normal.     Left Ear: Hearing, tympanic membrane, ear canal and external ear normal.  Eyes:     General: No scleral icterus.    Extraocular Movements: Extraocular movements intact.     Conjunctiva/sclera: Conjunctivae normal.     Pupils: Pupils are equal, round, and reactive to light.  Neck:     Thyroid: No thyroid mass or thyromegaly.  Cardiovascular:     Rate and Rhythm: Normal rate and regular rhythm.     Pulses: Normal pulses.          Radial pulses are 2+ on the right side and 2+ on the left side.     Heart sounds: Normal heart sounds. No murmur heard. Pulmonary:     Effort: Pulmonary effort is normal. No respiratory distress.     Breath sounds: Normal breath sounds. No wheezing, rhonchi or rales.  Abdominal:     General: Bowel sounds are normal. There is no distension.     Palpations: Abdomen is soft. There is no mass.     Tenderness: There is no abdominal tenderness. There is no guarding or rebound.     Hernia: No hernia is present.  Musculoskeletal:        General: Normal range of motion.     Cervical back: Normal range of motion and neck supple.     Right lower leg: No edema.     Left lower leg: No edema.  Lymphadenopathy:     Cervical: No cervical adenopathy.  Skin:    General: Skin is warm and dry.     Findings: No rash.  Neurological:     General: No focal deficit present.     Mental Status: He is alert and oriented to person, place, and time.  Psychiatric:        Mood and Affect: Mood normal.        Behavior: Behavior normal.        Thought Content: Thought content normal.        Judgment: Judgment normal.      Results for orders placed or performed in visit on 09/12/20  PSA  Result Value Ref Range   PSA 0.70 0.10 - 4.00 ng/mL   Hemoglobin A1c  Result Value Ref Range   Hgb A1c MFr Bld 6.0 4.6 - 6.5 %  Comprehensive metabolic panel  Result Value Ref Range   Sodium 136 135 - 145 mEq/L   Potassium 4.2 3.5 - 5.1 mEq/L   Chloride 102 96 - 112 mEq/L   CO2 27 19 -  32 mEq/L   Glucose, Bld 110 (H) 70 - 99 mg/dL   BUN 17 6 - 23 mg/dL   Creatinine, Ser 0.94 0.40 - 1.50 mg/dL   Total Bilirubin 1.3 (H) 0.2 - 1.2 mg/dL   Alkaline Phosphatase 51 39 - 117 U/L   AST 18 0 - 37 U/L   ALT 20 0 - 53 U/L   Total Protein 7.2 6.0 - 8.3 g/dL   Albumin 4.4 3.5 - 5.2 g/dL   GFR 91.08 >60.00 mL/min   Calcium 9.5 8.4 - 10.5 mg/dL  Lipid panel  Result Value Ref Range   Cholesterol 168 0 - 200 mg/dL   Triglycerides 58.0 0.0 - 149.0 mg/dL   HDL 53.00 >39.00 mg/dL   VLDL 11.6 0.0 - 40.0 mg/dL   LDL Cholesterol 103 (H) 0 - 99 mg/dL   Total CHOL/HDL Ratio 3    NonHDL 114.70     Assessment & Plan:  This visit occurred during the SARS-CoV-2 public health emergency.  Safety protocols were in place, including screening questions prior to the visit, additional usage of staff PPE, and extensive cleaning of exam room while observing appropriate contact time as indicated for disinfecting solutions.   Problem List Items Addressed This Visit     Health maintenance examination - Primary (Chronic)    Preventative protocols reviewed and updated unless pt declined. Discussed healthy diet and lifestyle.       Obesity, Class I, BMI 30-34.9    Encouraged healthy diet and lifestyle changes to affect sustainable weight loss.       HLD (hyperlipidemia)    Chronic, stable on rosuvastatin - continue. The 10-year ASCVD risk score (Arnett DK, et al., 2019) is: 4.1%   Values used to calculate the score:     Age: 80 years     Sex: Male     Is Non-Hispanic African American: No     Diabetic: No     Tobacco smoker: No     Systolic Blood Pressure: [id number] mmHg     Is BP treated: No     HDL Cholesterol: 53 mg/dL     Total Cholesterol: 168 mg/dL        Relevant Medications   rosuvastatin (CRESTOR) 20 MG tablet   lisinopril (ZESTRIL) 5 MG tablet   Prediabetes    Update A1c.       Onychomycosis    This has improved after oral lamisil course - now managing with PRN OTC topical funginail laquer      BPH (benign prostatic hyperplasia)    Continue flomax. Update PSA      Relevant Medications   tamsulosin (FLOMAX) 0.4 MG CAPS capsule   Hypertension    Mild. Continue low dose ACEI.       Relevant Medications   rosuvastatin (CRESTOR) 20 MG tablet   lisinopril (ZESTRIL) 5 MG tablet   Other Visit Diagnoses     Need for influenza vaccination       Relevant Orders   Flu Vaccine QUAD 10mo+IM (Fluarix, Fluzone & Alfiuria Quad PF) (Completed)   Need for hepatitis C screening test       Special screening for malignant neoplasm of prostate            Meds ordered this encounter  Medications   rosuvastatin (CRESTOR) 20 MG tablet    Sig: Take 1 tablet (20 mg total) by mouth daily.    Dispense:  90 tablet    Refill:  3   lisinopril (ZESTRIL) 5  MG tablet    Sig: Take 1 tablet (5 mg total) by mouth daily.    Dispense:  90 tablet    Refill:  3   tamsulosin (FLOMAX) 0.4 MG CAPS capsule    Sig: Take 1 capsule (0.4 mg total) by mouth daily after supper.    Dispense:  90 capsule    Refill:  3   Orders Placed This Encounter  Procedures   Flu Vaccine QUAD 36mo+IM (Fluarix, Fluzone & Alfiuria Quad PF)    Patient instructions: Labs today Flu shot today  Check on shingrix vaccines - I only have records of you getting 1 at our office.  Good to see you today  Return as needed or in 1 year for next physical.   Follow up plan: Return in about 1 year (around 10/14/2022) for annual exam, prior fasting for blood work.  Eustaquio Boyden, MD

## 2021-10-14 NOTE — Assessment & Plan Note (Signed)
Chronic, stable on rosuvastatin - continue. The 10-year ASCVD risk score (Arnett DK, et al., 2019) is: 4.1%   Values used to calculate the score:     Age: 55 years     Sex: Male     Is Non-Hispanic African American: No     Diabetic: No     Tobacco smoker: No     Systolic Blood Pressure: 124 mmHg     Is BP treated: No     HDL Cholesterol: 53 mg/dL     Total Cholesterol: 168 mg/dL

## 2021-10-14 NOTE — Assessment & Plan Note (Signed)
This has improved after oral lamisil course - now managing with PRN OTC topical funginail laquer

## 2021-10-14 NOTE — Assessment & Plan Note (Signed)
Preventative protocols reviewed and updated unless pt declined. Discussed healthy diet and lifestyle.  

## 2021-10-14 NOTE — Assessment & Plan Note (Signed)
Encouraged healthy diet and lifestyle changes to affect sustainable weight loss.  

## 2021-10-14 NOTE — Assessment & Plan Note (Addendum)
Mild. Continue low dose ACEI.

## 2021-10-14 NOTE — Assessment & Plan Note (Signed)
Update A1c ?

## 2021-10-15 LAB — HEMOGLOBIN A1C: Hgb A1c MFr Bld: 5.9 % (ref 4.6–6.5)

## 2021-10-15 LAB — LIPID PANEL
Cholesterol: 198 mg/dL (ref 0–200)
HDL: 62.2 mg/dL (ref >39.00)
LDL Cholesterol: 113 mg/dL — ABNORMAL HIGH (ref 0–99)
NonHDL: 136
Total CHOL/HDL Ratio: 3
Triglycerides: 113 mg/dL (ref 0.0–149.0)
VLDL: 22.6 mg/dL (ref 0.0–40.0)

## 2021-10-15 LAB — PSA: PSA: 1.1 ng/mL (ref 0.10–4.00)

## 2021-10-15 LAB — HEPATITIS C ANTIBODY
Hepatitis C Ab: NONREACTIVE
SIGNAL TO CUT-OFF: 0.02 (ref <1.00)

## 2021-10-15 LAB — COMPREHENSIVE METABOLIC PANEL
ALT: 21 U/L (ref 0–53)
AST: 20 U/L (ref 0–37)
Albumin: 4.6 g/dL (ref 3.5–5.2)
Alkaline Phosphatase: 51 U/L (ref 39–117)
BUN: 13 mg/dL (ref 6–23)
CO2: 27 mEq/L (ref 19–32)
Calcium: 9.9 mg/dL (ref 8.4–10.5)
Chloride: 103 mEq/L (ref 96–112)
Creatinine, Ser: 0.94 mg/dL (ref 0.40–1.50)
GFR: 91.06 mL/min (ref >60.00)
Glucose, Bld: 88 mg/dL (ref 70–99)
Potassium: 4.1 mEq/L (ref 3.5–5.1)
Sodium: 138 mEq/L (ref 135–145)
Total Bilirubin: 1.6 mg/dL — ABNORMAL HIGH (ref 0.2–1.2)
Total Protein: 7.4 g/dL (ref 6.0–8.3)

## 2021-11-11 ENCOUNTER — Encounter: Payer: Self-pay | Admitting: Family Medicine

## 2022-04-19 ENCOUNTER — Encounter: Payer: Self-pay | Admitting: Family Medicine

## 2022-04-19 ENCOUNTER — Telehealth (INDEPENDENT_AMBULATORY_CARE_PROVIDER_SITE_OTHER): Payer: BC Managed Care – PPO | Admitting: Family Medicine

## 2022-04-19 DIAGNOSIS — U071 COVID-19: Secondary | ICD-10-CM

## 2022-04-19 HISTORY — DX: COVID-19: U07.1

## 2022-04-19 MED ORDER — NIRMATRELVIR/RITONAVIR (PAXLOVID)TABLET: 3.0000 | ORAL_TABLET | Freq: Two times a day (BID) | ORAL | 0 refills | Status: AC

## 2022-04-19 NOTE — Patient Instructions (Addendum)
Drink fluids and rest  Mucinex/expectorant is good for cough and congestion (can add DM for cough if it gets worse Nasal saline for congestion as needed  Tylenol for fever or pain or headache You can add ibuprofen with food if needed up to every 6-8 hours   Please alert [place] if symptoms worsen (if severe or short of breath please go to the ER)   Take the paxlovid as directed If side effects, hold it and let us know   Continue to isolate for 5 days minimum (longer for symptoms)  Then mask for 10 days minimum  Update if not starting to improve in a week or if worsening

## 2022-04-19 NOTE — Progress Notes (Signed)
Virtual Visit via Video Note  I connected with Johnny Daniel on 04/19/22 at 11:30 AM EDT by a video enabled telemedicine application and verified that I am speaking with the correct person using two identifiers.  Location: Patient: home Provider: office   I discussed the limitations of evaluation and management by telemedicine and the availability of in person appointments. The patient expressed understanding and agreed to proceed.  History of Present Illness: 56 yo pt of Dr Darnell Level presents for covid 19   Caught it at a wedding  Several family members have it now   Utd with immunizations for covid    Started yesterday am - was very tired  Cough- so far dry and not severe  No sob, no wheezing  Fever-100.2  Just a little achey  Nasal congestion  Fatigue  Some mild headache   Has not lost taste or smell    Otc: Guaifenesin  Tylenol ES every 6 hours   Has not tried nsaid   Lab Results  Component Value Date   CREATININE 0.94 10/14/2021   BUN 13 10/14/2021   NA 138 10/14/2021   K 4.1 10/14/2021   CL 103 10/14/2021   CO2 27 10/14/2021    Patient Active Problem List   Diagnosis Date Noted   Left cervical radiculopathy 02/25/2021   Tinnitus 02/25/2021   BPH (benign prostatic hyperplasia) 09/14/2019   Hypertension 09/14/2019   Onychomycosis 06/21/2017   Health maintenance examination 03/14/2015   OSA (obstructive sleep apnea) 03/14/2015   Prediabetes 03/14/2015   Left knee pain 09/05/2014   Obesity, Class I, BMI 30-34.9    HLD (hyperlipidemia)    Past Medical History:  Diagnosis Date   Headache    HLD (hyperlipidemia)    Obesity    Past Surgical History:  Procedure Laterality Date   COLONOSCOPY  06/2017   WNL, diverticulosis, rpt 10 yrs Carlean Purl)   KNEE ARTHROSCOPY WITH MEDIAL MENISECTOMY Left 05/20/2015   Left knee arthroscopy with partial medial meniscectomy;  Surgeon: Christophe Louis, MD   [place] Right 1982   football injury   TRIGGER  FINGER RELEASE  2017   Social History   Tobacco Use   Smoking status: Former    Packs/day: 1.00    Years: 1.00    Pack years: 1.00    Types: Cigarettes    Start date: 11/30/1987    Quit date: 11/29/1989    Years since quitting: 32.4   Smokeless tobacco: Never  Vaping Use   Vaping Use: Never used  Substance Use Topics   Alcohol use: Yes    Alcohol/week: 0.0 standard drinks    Comment: Occasional   Drug use: No   Family History  Problem Relation Age of Onset   Cancer Father        lung (smoker)   Stroke Father        smoker   CAD Father 23       smoker   CAD Maternal Grandfather 73       MI   Cancer Maternal Grandmother        liver   Liver cancer Maternal Grandmother    Colon cancer Neg Hx    Colon polyps Neg Hx    Esophageal cancer Neg Hx    Rectal cancer Neg Hx    Stomach cancer Neg Hx    No Known Allergies Current Outpatient Medications on File Prior to Visit  Medication Sig Dispense Refill   GARLIC PO Take 1 capsule by mouth  daily.     lisinopril (ZESTRIL) 5 MG tablet Take 1 tablet (5 mg total) by mouth daily. 90 tablet 3   Omega-3 Fatty Acids (FISH OIL PO) Take by mouth. Takes a few times a week     rosuvastatin (CRESTOR) 20 MG tablet Take 1 tablet (20 mg total) by mouth daily. 90 tablet 3   tamsulosin (FLOMAX) 0.4 MG CAPS capsule Take 1 capsule (0.4 mg total) by mouth daily after supper. 90 capsule 3   Turmeric 500 MG CAPS Take 1 capsule by mouth 2 (two) times daily.      No current facility-administered medications on file prior to visit.   Review of Systems  Constitutional:  Positive for fever and malaise/fatigue. Negative for chills.  HENT:  Positive for congestion. Negative for ear pain, sinus pain and sore throat.   Eyes:  Negative for blurred vision, discharge and redness.  Respiratory:  Positive for cough. Negative for sputum production, shortness of breath, wheezing and stridor.   Cardiovascular:  Negative for chest pain, palpitations and leg  swelling.  Gastrointestinal:  Negative for abdominal pain, diarrhea, nausea and vomiting.  Musculoskeletal:  Negative for myalgias.  Skin:  Negative for rash.  Neurological:  Positive for headaches. Negative for dizziness.    Observations/Objective: Patient appears well, in no distress Weight is baseline  No facial swelling or asymmetry Normal voice-not hoarse and no slurred speech Sounds nasally congested, occ clears throat  No obvious tremor or mobility impairment Moving neck and UEs normally Able to hear the call well  No cough or shortness of breath during interview  Talkative and mentally sharp with no cognitive changes No skin changes on face or neck , no rash or pallor Affect is normal    Assessment and Plan: Problem List Items Addressed This Visit       Other   COVID-19    Symptoms day 2 with pos home test , fatigue and cough but no sob  Temp is low grade  Disc symptom control, can add nsaid if needed with food for fever/pain  Continue expectorant In light of risk factors (age, weight) , paxlovid sent to the pharmacy  Rev poss side eff, will update  Handouts given  ER precautions discussed Isolation parameters discussed Update if not starting to improve in a week or if worsening         Relevant Medications   nirmatrelvir/ritonavir EUA (PAXLOVID) 20 x 150 MG & 10 x 100MG  TABS     Follow Up Instructions:   Drink fluids and rest  Mucinex/expectorant is good for cough and congestion (can add DM for cough if it gets worse_ Nasal saline for congestion as needed  Tylenol for fever or pain or headache You can add ibuprofen with food if needed up to every 6-8 hours   Please alert [place] if symptoms worsen (if severe or short of breath please go to the ER)   Take the paxlovid as directed If side effects, hold it and let us know   Continue to isolate for 5 days minimum (longer for symptoms)  Then mask for 10 days minimum  Update if not starting to improve in a  week or if worsening  I discussed the assessment and treatment plan with the patient. The patient was provided an opportunity to ask questions and all were answered. The patient agreed with the plan and demonstrated an understanding of the instructions.   The patient was advised to call back or seek an in-person evaluation if  the symptoms worsen or if the condition fails to improve as anticipated.     Loura Pardon, MD

## 2022-04-19 NOTE — Telephone Encounter (Signed)
Noted. Appreciate Dr. Tower seeing patient.  

## 2022-04-19 NOTE — Assessment & Plan Note (Signed)
Symptoms day 2 with pos home test , fatigue and cough but no sob  Temp is low grade  Disc symptom control, can add nsaid if needed with food for fever/pain  Continue expectorant In light of risk factors (age, weight) , paxlovid sent to the pharmacy  Rev poss side eff, will update  Handouts given  ER precautions discussed Isolation parameters discussed Update if not starting to improve in a week or if worsening

## 2022-04-19 NOTE — Telephone Encounter (Addendum)
Spoke pt asking about sxs.  C/o cough, nasal congestion, fatigue, fever- max 100.2 and HA.  Sxs started 04/18/22.  Pos home COVID test this AM.  Scheduled MyChart visit today with Dr. Milinda Antis at 11:30.  Fyi to Dr. Reece Agar.

## 2022-10-11 ENCOUNTER — Other Ambulatory Visit: Payer: Self-pay | Admitting: Family Medicine

## 2022-10-11 DIAGNOSIS — N401 Enlarged prostate with lower urinary tract symptoms: Secondary | ICD-10-CM

## 2022-10-11 DIAGNOSIS — R7303 Prediabetes: Secondary | ICD-10-CM

## 2022-10-11 DIAGNOSIS — E7800 Pure hypercholesterolemia, unspecified: Secondary | ICD-10-CM

## 2022-10-13 ENCOUNTER — Other Ambulatory Visit (INDEPENDENT_AMBULATORY_CARE_PROVIDER_SITE_OTHER): Payer: BC Managed Care – PPO

## 2022-10-13 DIAGNOSIS — E7800 Pure hypercholesterolemia, unspecified: Secondary | ICD-10-CM | POA: Diagnosis not present

## 2022-10-13 DIAGNOSIS — R7303 Prediabetes: Secondary | ICD-10-CM | POA: Diagnosis not present

## 2022-10-13 DIAGNOSIS — R3914 Feeling of incomplete bladder emptying: Secondary | ICD-10-CM | POA: Diagnosis not present

## 2022-10-13 DIAGNOSIS — N401 Enlarged prostate with lower urinary tract symptoms: Secondary | ICD-10-CM

## 2022-10-14 LAB — LIPID PANEL
Cholesterol: 172 mg/dL (ref 0–200)
HDL: 58.3 mg/dL (ref >39.00)
LDL Cholesterol: 95 mg/dL (ref 0–99)
NonHDL: 113.8
Total CHOL/HDL Ratio: 3
Triglycerides: 94 mg/dL (ref 0.0–149.0)
VLDL: 18.8 mg/dL (ref 0.0–40.0)

## 2022-10-14 LAB — PSA: PSA: 0.97 ng/mL (ref 0.10–4.00)

## 2022-10-14 LAB — COMPREHENSIVE METABOLIC PANEL
ALT: 20 U/L (ref 0–53)
AST: 20 U/L (ref 0–37)
Albumin: 4.4 g/dL (ref 3.5–5.2)
Alkaline Phosphatase: 61 U/L (ref 39–117)
BUN: 12 mg/dL (ref 6–23)
CO2: 29 mEq/L (ref 19–32)
Calcium: 9.9 mg/dL (ref 8.4–10.5)
Chloride: 101 mEq/L (ref 96–112)
Creatinine, Ser: 0.89 mg/dL (ref 0.40–1.50)
GFR: 95.6 mL/min (ref >60.00)
Glucose, Bld: 88 mg/dL (ref 70–99)
Potassium: 4.5 mEq/L (ref 3.5–5.1)
Sodium: 138 mEq/L (ref 135–145)
Total Bilirubin: 1.2 mg/dL (ref 0.2–1.2)
Total Protein: 7.3 g/dL (ref 6.0–8.3)

## 2022-10-14 LAB — HEMOGLOBIN A1C: Hgb A1c MFr Bld: 6.1 % (ref 4.6–6.5)

## 2022-10-19 ENCOUNTER — Encounter: Payer: Self-pay | Admitting: Family Medicine

## 2022-10-19 ENCOUNTER — Ambulatory Visit (INDEPENDENT_AMBULATORY_CARE_PROVIDER_SITE_OTHER): Payer: BC Managed Care – PPO | Admitting: Family Medicine

## 2022-10-19 VITALS — BP 124/72 | HR 65 | Temp 98.0°F | Ht 66.75 in | Wt 216.6 lb

## 2022-10-19 DIAGNOSIS — Z0000 Encounter for general adult medical examination without abnormal findings: Secondary | ICD-10-CM | POA: Diagnosis not present

## 2022-10-19 DIAGNOSIS — R7303 Prediabetes: Secondary | ICD-10-CM

## 2022-10-19 DIAGNOSIS — E669 Obesity, unspecified: Secondary | ICD-10-CM | POA: Diagnosis not present

## 2022-10-19 DIAGNOSIS — I10 Essential (primary) hypertension: Secondary | ICD-10-CM

## 2022-10-19 DIAGNOSIS — E7800 Pure hypercholesterolemia, unspecified: Secondary | ICD-10-CM

## 2022-10-19 DIAGNOSIS — E66811 Obesity, class 1: Secondary | ICD-10-CM

## 2022-10-19 MED ORDER — LISINOPRIL 5 MG PO TABS: 5.0000 mg | ORAL_TABLET | Freq: Every day | ORAL | 3 refills | Status: DC

## 2022-10-19 MED ORDER — TAMSULOSIN HCL 0.4 MG PO CAPS: 0.4000 mg | ORAL_CAPSULE | Freq: Every day | ORAL | 3 refills | Status: DC

## 2022-10-19 MED ORDER — ROSUVASTATIN CALCIUM 20 MG PO TABS: 20.0000 mg | ORAL_TABLET | Freq: Every day | ORAL | 3 refills | Status: DC

## 2022-10-19 NOTE — Progress Notes (Signed)
Patient ID: Johnny Daniel, male    DOB: 1966/10/16, 56 y.o.   MRN: [id number]  This visit was conducted in person.  BP 124/72   Pulse 65   Temp 98 F (36.7 C) (Temporal)   Ht 5' 6.75" (1.695 m)   Wt 216 lb 9.6 oz (98.2 kg)   SpO2 98%   BMI 34.18 kg/m    CC: CPE Subjective:   HPI: Johnny Daniel is a 56 y.o. male presenting on 10/19/2022 for Annual Exam   COVID infection 03/2022 - treated with Paxlovid, symptoms fully resolved  Preventative: COLONOSCOPY 06/2017 - WNL, diverticulosis, rpt 10 yrs [facility]).  Prostate cancer screening - discussed. Yearly PSA screening. Nocturia x1.  Lung cancer screening - not eligible  Flu shot yearly.  COVID vaccine Pfizer 11/2019, 01/2020, Pfizer booster 07/2020, Moderna bivalent 08/2021, Pfizer 09/2022 Td 2010. Tdap 08/2019 Shingrix - 09/2020, due for 2nd  Seat belt use discussed.  Sunscreen use discussed. No changing moles on skin.  Ex smoker - 2PY hx, quit 1989. 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 - hasn't been going  Diet: good water, fruits/vegetables daily, low carb diet      Relevant past medical, surgical, family and social history reviewed and updated as indicated. Interim medical history since our last visit reviewed. Allergies and medications reviewed and updated. Outpatient Medications Prior to Visit  Medication Sig Dispense Refill   GARLIC PO Take 1 capsule by mouth daily.     Turmeric 500 MG CAPS Take 1 capsule by mouth 2 (two) times daily.      lisinopril (ZESTRIL) 5 MG tablet Take 1 tablet (5 mg total) by mouth daily. 90 tablet 3   rosuvastatin (CRESTOR) 20 MG tablet Take 1 tablet (20 mg total) by mouth daily. 90 tablet 3    tamsulosin (FLOMAX) 0.4 MG CAPS capsule Take 1 capsule (0.4 mg total) by mouth daily after supper. 90 capsule 3   Omega-3 Fatty Acids (FISH OIL PO) Take by mouth. Takes a few times a week     No facility-administered medications prior to visit.     Per HPI unless specifically indicated in ROS section below Review of Systems  Constitutional:  Negative for activity change, appetite change, chills, fatigue, fever and unexpected weight change.  HENT:  Negative for hearing loss.   Eyes:  Negative for visual disturbance.  Respiratory:  Negative for cough, chest tightness, shortness of breath and wheezing.   Cardiovascular:  Negative for chest pain, palpitations and leg swelling.  Gastrointestinal:  Negative for abdominal distention, abdominal pain, blood in stool, constipation, diarrhea, nausea and vomiting.  Genitourinary:  Negative for difficulty urinating and hematuria.  Musculoskeletal:  Negative for arthralgias, myalgias and neck pain.  Skin:  Negative for rash.  Neurological:  Negative for dizziness, seizures, syncope and headaches.  Hematological:  Negative for adenopathy. Does not bruise/bleed easily.  Psychiatric/Behavioral:  Negative for dysphoric mood. The patient is not nervous/anxious.     Objective:  BP 124/72   Pulse 65   Temp 98 F (36.7 C) (Temporal)   Ht 5' 6.75" (1.695 m)   Wt 216 lb 9.6 oz (98.2 kg)   SpO2 98%   BMI 34.18 kg/m  Wt Readings from Last 3 Encounters:  10/19/22 216 lb 9.6 oz (98.2 kg)  04/19/22 215 lb (97.5 kg)  10/14/21 215 lb 3 oz (97.6 kg)      Physical Exam Vitals and nursing note reviewed.  Constitutional:      General: He is not in acute distress.    Appearance: Normal appearance. He is well-developed. He is not ill-appearing.  HENT:     Head: Normocephalic and atraumatic.     Right Ear: Hearing, tympanic membrane, ear canal and external ear normal.     Left Ear: Hearing, tympanic membrane, ear canal and external ear normal.     Nose:  Nose normal.     Mouth/Throat:     Mouth: Mucous membranes are moist.     Pharynx: Oropharynx is clear. No oropharyngeal exudate or posterior oropharyngeal erythema.  Eyes:     General: No scleral icterus.    Extraocular Movements: Extraocular movements intact.     Conjunctiva/sclera: Conjunctivae normal.     Pupils: Pupils are equal, round, and reactive to light.  Neck:     Thyroid: No thyroid mass or thyromegaly.  Cardiovascular:     Rate and Rhythm: Normal rate and regular rhythm.     Pulses: Normal pulses.          Radial pulses are 2+ on the right side and 2+ on the left side.     Heart sounds: Normal heart sounds. No murmur heard. Pulmonary:     Effort: Pulmonary effort is normal. No respiratory distress.     Breath sounds: Normal breath sounds. No wheezing, rhonchi or rales.  Abdominal:     General: Bowel sounds are normal. There is no distension.     Palpations: Abdomen is soft. There is no mass.     Tenderness: There is no abdominal tenderness. There is no guarding or rebound.     Hernia: No hernia is present.  Musculoskeletal:        General: Normal range of motion.     Cervical back: Normal range of motion and neck supple.     Right lower leg: No edema.     Left lower leg: No edema.  Lymphadenopathy:     Cervical: No cervical adenopathy.  Skin:    General: Skin is warm and dry.     Findings: No rash.  Neurological:     General: No focal deficit present.     Mental Status: He is alert and oriented to person, place, and time.  Psychiatric:        Mood and Affect: Mood normal.        Behavior: Behavior normal.        Thought Content: Thought content normal.        Judgment: Judgment normal.       Results for orders placed or performed in visit on 10/13/22  PSA  Result Value Ref Range   PSA 0.97 0.10 - 4.00 ng/mL  Lipid panel  Result Value Ref Range   Cholesterol 172 0 - 200 mg/dL   Triglycerides 09.4 0.0 - 149.0 mg/dL   HDL 70.96 >28.36 mg/dL   VLDL 62.9  0.0 - 47.6 mg/dL   LDL Cholesterol 95 0 - 99 mg/dL   Total CHOL/HDL Ratio 3    NonHDL 113.80   Comprehensive metabolic panel  Result Value Ref Range   Sodium 138 135 - 145 mEq/L   Potassium 4.5 3.5 - 5.1 mEq/L   Chloride 101 96 - 112 mEq/L  CO2 29 19 - 32 mEq/L   Glucose, Bld 88 70 - 99 mg/dL   BUN 12 6 - 23 mg/dL   Creatinine, Ser 3.71 0.40 - 1.50 mg/dL   Total Bilirubin 1.2 0.2 - 1.2 mg/dL   Alkaline Phosphatase 61 39 - 117 U/L   AST 20 0 - 37 U/L   ALT 20 0 - 53 U/L   Total Protein 7.3 6.0 - 8.3 g/dL   Albumin 4.4 3.5 - 5.2 g/dL   GFR 06.26 >94.85 mL/min   Calcium 9.9 8.4 - 10.5 mg/dL  Hemoglobin I6E  Result Value Ref Range   Hgb A1c MFr Bld 6.1 4.6 - 6.5 %    Assessment & Plan:   Problem List Items Addressed This Visit     Health maintenance examination - Primary (Chronic)    Preventative protocols reviewed and updated unless pt declined. Discussed healthy diet and lifestyle.       Obesity, Class I, BMI 30-34.9    Encouraged healthy diet and lifestyle choices to affect sustainable weight loss.       HLD (hyperlipidemia)    Chronic, stable on crestor 20mg  daily - continue this. The 10-year ASCVD risk score (Arnett DK, et al., 2019) is: 5%   Values used to calculate the score:     Age: 34 years     Sex: Male     Is Non-Hispanic African American: No     Diabetic: No     Tobacco smoker: No     Systolic Blood Pressure: 124 mmHg     Is BP treated: Yes     HDL Cholesterol: 58.3 mg/dL     Total Cholesterol: 172 mg/dL       Relevant Medications   lisinopril (ZESTRIL) 5 MG tablet   rosuvastatin (CRESTOR) 20 MG tablet   Prediabetes    Reviewed, discussed limiting added sugars/carbs in diet.       Hypertension    Chronic, stable on low dose ACEI.       Relevant Medications   lisinopril (ZESTRIL) 5 MG tablet   rosuvastatin (CRESTOR) 20 MG tablet     Meds ordered this encounter  Medications   lisinopril (ZESTRIL) 5 MG tablet    Sig: Take 1 tablet (5 mg  total) by mouth daily.    Dispense:  90 tablet    Refill:  3   rosuvastatin (CRESTOR) 20 MG tablet    Sig: Take 1 tablet (20 mg total) by mouth daily.    Dispense:  90 tablet    Refill:  3   tamsulosin (FLOMAX) 0.4 MG CAPS capsule    Sig: Take 1 capsule (0.4 mg total) by mouth daily after supper.    Dispense:  90 capsule    Refill:  3   No orders of the defined types were placed in this encounter.   Patient instructions: Return at your convenience for 2nd and final shingrix shot (nurse visit) or get at local pharmacy.  Continue current medicines. Watch sugars in diet to keep prediabetes under control.  You are doing well today Return as needed or in 1 year for next physical.   Follow up plan: Return in about 1 year (around 10/20/2023) for annual exam, prior fasting for blood work.  10/22/2023, MD

## 2022-10-19 NOTE — Assessment & Plan Note (Signed)
Preventative protocols reviewed and updated unless pt declined. Discussed healthy diet and lifestyle.  

## 2022-10-19 NOTE — Assessment & Plan Note (Signed)
Chronic, stable on low dose ACEI.  

## 2022-10-19 NOTE — Assessment & Plan Note (Signed)
Chronic, stable on crestor 20mg  daily - continue this. The 10-year ASCVD risk score (Arnett DK, et al., 2019) is: 5%   Values used to calculate the score:     Age: 56 years     Sex: Male     Is Non-Hispanic African American: No     Diabetic: No     Tobacco smoker: No     Systolic Blood Pressure: 124 mmHg     Is BP treated: Yes     HDL Cholesterol: 58.3 mg/dL     Total Cholesterol: 172 mg/dL

## 2022-10-19 NOTE — Assessment & Plan Note (Signed)
Reviewed, discussed limiting added sugars/carbs in diet.

## 2022-10-19 NOTE — Patient Instructions (Addendum)
Return at your convenience for 2nd and final shingrix shot (nurse visit) or get at local pharmacy.  Continue current medicines. Watch sugars in diet to keep prediabetes under control.  You are doing well today Return as needed or in 1 year for next physical.   Health Maintenance, Male Adopting a healthy lifestyle and getting preventive care are important in promoting health and wellness. Ask your health care provider about: The right schedule for you to have regular tests and exams. Things you can do on your own to prevent diseases and keep yourself healthy. What should I know about diet, weight, and exercise? Eat a healthy diet  Eat a diet that includes plenty of vegetables, fruits, low-fat dairy products, and lean protein. Do not eat a lot of foods that are high in solid fats, added sugars, or sodium. Maintain a healthy weight Body mass index (BMI) is a measurement that can be used to identify possible weight problems. It estimates body fat based on height and weight. Your health care provider can help determine your BMI and help you achieve or maintain a healthy weight. Get regular exercise Get regular exercise. This is one of the most important things you can do for your health. Most adults should: Exercise for at least 150 minutes each week. The exercise should increase your heart rate and make you sweat (moderate-intensity exercise). Do strengthening exercises at least twice a week. This is in addition to the moderate-intensity exercise. Spend less time sitting. Even light physical activity can be beneficial. Watch cholesterol and blood lipids Have your blood tested for lipids and cholesterol at 56 years of age, then have this test every 5 years. You may need to have your cholesterol levels checked more often if: Your lipid or cholesterol levels are high. You are older than 56 years of age. You are at high risk for heart disease. What should I know about cancer screening? Many types  of cancers can be detected early and may often be prevented. Depending on your health history and family history, you may need to have cancer screening at various ages. This may include screening for: Colorectal cancer. Prostate cancer. Skin cancer. Lung cancer. What should I know about heart disease, diabetes, and high blood pressure? Blood pressure and heart disease High blood pressure causes heart disease and increases the risk of stroke. This is more likely to develop in people who have high blood pressure readings or are overweight. Talk with your health care provider about your target blood pressure readings. Have your blood pressure checked: Every 3-5 years if you are 39-53 years of age. Every year if you are 2 years old or older. If you are between the ages of 29 and 10 and are a current or former smoker, ask your health care provider if you should have a one-time screening for abdominal aortic aneurysm (AAA). Diabetes Have regular diabetes screenings. This checks your fasting blood sugar level. Have the screening done: Once every three years after age 44 if you are at a normal weight and have a low risk for diabetes. More often and at a younger age if you are overweight or have a high risk for diabetes. What should I know about preventing infection? Hepatitis B If you have a higher risk for hepatitis B, you should be screened for this virus. Talk with your health care provider to find out if you are at risk for hepatitis B infection. Hepatitis C Blood testing is recommended for: Everyone born from [birth year] through  [place] with known risk factors for hepatitis C. Sexually transmitted infections (STIs) You should be screened each year for STIs, including gonorrhea and chlamydia, if: You are sexually active and are younger than 56 years of age. You are older than 56 years of age and your health care provider tells you that you are at risk for this type of infection. Your sexual  activity has changed since you were last screened, and you are at increased risk for chlamydia or gonorrhea. Ask your health care provider if you are at risk. Ask your health care provider about whether you are at high risk for HIV. Your health care provider may recommend a prescription medicine to help prevent HIV infection. If you choose to take medicine to prevent HIV, you should first get tested for HIV. You should then be tested every 3 months for as long as you are taking the medicine. Follow these instructions at home: Alcohol use Do not drink alcohol if your health care provider tells you not to drink. If you drink alcohol: Limit how much you have to 0-2 drinks a day. Know how much alcohol is in your drink. In the [place] one drink equals one 12 oz bottle of beer (355 mL), one 5 oz glass of wine (148 mL), or one 1 oz glass of hard liquor (44 mL). Lifestyle Do not use any products that contain nicotine or tobacco. These products include cigarettes, chewing tobacco, and vaping devices, such as e-cigarettes. If you need help quitting, ask your health care provider. Do not use street drugs. Do not share needles. Ask your health care provider for help if you need support or information about quitting drugs. General instructions Schedule regular health, dental, and eye exams. Stay current with your vaccines. Tell your health care provider if: You often feel depressed. You have ever been abused or do not feel safe at home. Summary Adopting a healthy lifestyle and getting preventive care are important in promoting health and wellness. Follow your health care provider's instructions about healthy diet, exercising, and getting tested or screened for diseases. Follow your health care provider's instructions on monitoring your cholesterol and blood pressure. This information is not intended to replace advice given to you by your health care provider. Make sure you discuss any questions you have  with your health care provider. Document Revised: 04/06/2021 Document Reviewed: 04/06/2021 Elsevier Patient Education  [employer].

## 2022-10-19 NOTE — Assessment & Plan Note (Signed)
Encouraged healthy diet and lifestyle choices to affect sustainable weight loss.  ?

## 2022-11-16 ENCOUNTER — Ambulatory Visit (INDEPENDENT_AMBULATORY_CARE_PROVIDER_SITE_OTHER): Payer: BC Managed Care – PPO

## 2022-11-16 DIAGNOSIS — Z23 Encounter for immunization: Secondary | ICD-10-CM

## 2023-05-02 ENCOUNTER — Encounter: Payer: Self-pay | Admitting: Family Medicine

## 2023-05-09 ENCOUNTER — Encounter: Payer: Self-pay | Admitting: Family Medicine

## 2023-05-09 ENCOUNTER — Ambulatory Visit: Payer: BC Managed Care – PPO | Admitting: Family Medicine

## 2023-05-09 ENCOUNTER — Telehealth: Payer: Self-pay | Admitting: Family Medicine

## 2023-05-09 VITALS — BP 132/78 | HR 71 | Temp 97.4°F | Ht 66.75 in | Wt 221.2 lb

## 2023-05-09 DIAGNOSIS — M5431 Sciatica, right side: Secondary | ICD-10-CM | POA: Insufficient documentation

## 2023-05-09 DIAGNOSIS — R1031 Right lower quadrant pain: Secondary | ICD-10-CM | POA: Insufficient documentation

## 2023-05-09 LAB — POC URINALSYSI DIPSTICK (AUTOMATED)
Bilirubin, UA: NEGATIVE
Blood, UA: NEGATIVE
Glucose, UA: NEGATIVE
Ketones, UA: NEGATIVE
Leukocytes, UA: NEGATIVE
Nitrite, UA: NEGATIVE
Protein, UA: NEGATIVE
Spec Grav, UA: 1.015 (ref 1.010–1.025)
Urobilinogen, UA: 0.2 E.U./dL
pH, UA: 6 (ref 5.0–8.0)

## 2023-05-09 NOTE — Progress Notes (Addendum)
Ph: [phone] Fax: [phone]   Patient ID: Ghazi Bausman, male    DOB: 1966/08/27, 57 y.o.   MRN: [id number]  This visit was conducted in person.  BP 132/78   Pulse 71   Temp (!) 97.4 F (36.3 C) (Temporal)   Ht 5' 6.75" (1.695 m)   Wt 221 lb 4 oz (100.4 kg)   SpO2 98%   BMI 34.91 kg/m    CC: R groin pain  Subjective:   HPI: Nickson Dimare is a 57 y.o. male presenting on 05/09/2023 for Groin Pain (C/o R groin pain near testicle. Started 2-3 mos ago. )   2-3 mo h/o R groin pain superior to R testicle without inciting trauma/injury or fall. He notes this did start after regular exercising - stair-master, walking on treadmill, stationary bicycle. Squats worsen pain. He was wearing exercise shorts with built in mesh briefs. He started using compression shorts and briefs without significant improvement. Discomfort described as fullness sensation, no noted swelling. Pain seems to localize to R epididymis. Has felt possible mass to the epididymis.   No fevers/chills, dysuria, discharge, no noted bulge to groin.  No lower back pain, numbness/weakness down legs, no saddle anesthesia or bowel/bladder incontinence. Treating at home with ibuprofen with temporary relief.   Over the past year he's been driving more - sitting in car - 2 hours every day.  This has led to developing R sciatic discomfort with prolonged sitting.  No lower back pain.   H/o vasectomy 20 yrs ago.      Relevant past medical, surgical, family and social history reviewed and updated as indicated. Interim medical history since our last visit reviewed. Allergies and medications reviewed and updated. Outpatient Medications Prior to Visit  Medication Sig Dispense Refill   GARLIC PO Take 1 capsule by mouth daily.     lisinopril (ZESTRIL) 5 MG tablet Take 1 tablet (5 mg total) by mouth daily. 90 tablet 3   rosuvastatin (CRESTOR) 20 MG tablet Take 1 tablet (20 mg total) by mouth daily. 90 tablet 3   tamsulosin  (FLOMAX) 0.4 MG CAPS capsule Take 1 capsule (0.4 mg total) by mouth daily after supper. 90 capsule 3   Turmeric 500 MG CAPS Take 1 capsule by mouth 2 (two) times daily.      No facility-administered medications prior to visit.     Per HPI unless specifically indicated in ROS section below Review of Systems  Objective:  BP 132/78   Pulse 71   Temp (!) 97.4 F (36.3 C) (Temporal)   Ht 5' 6.75" (1.695 m)   Wt 221 lb 4 oz (100.4 kg)   SpO2 98%   BMI 34.91 kg/m   Wt Readings from Last 3 Encounters:  05/09/23 221 lb 4 oz (100.4 kg)  10/19/22 216 lb 9.6 oz (98.2 kg)  04/19/22 215 lb (97.5 kg)      Physical Exam Vitals and nursing note reviewed.  Constitutional:      Appearance: Normal appearance. He is not ill-appearing.  Abdominal:     General: Bowel sounds are normal. There is no distension.     Palpations: Abdomen is soft. There is no mass.     Tenderness: There is no abdominal tenderness. There is no right CVA tenderness, left CVA tenderness, guarding or rebound.     Hernia: No hernia is present. There is no hernia in the left inguinal area or right inguinal area.  Genitourinary:    Penis: Normal and circumcised.  Testes: Normal. Cremasteric reflex is present.        Right: Mass, tenderness or swelling not present.        Left: Mass, tenderness or swelling not present.     Epididymis:     Right: Not enlarged. Mass present. No tenderness.     Left: Not enlarged. No mass or tenderness.     Comments: Small nontender mass mid R epididymis  Musculoskeletal:        General: No tenderness. Normal range of motion.     Right lower leg: No edema.     Left lower leg: No edema.     Comments:  No pain midline spine No paraspinous mm tenderness Neg SLR bilaterally. No pain with int/ext rotation at hip. No pain at SIJ, GTB or sciatic notch bilaterally.   Lymphadenopathy:     Lower Body: No right inguinal adenopathy. No left inguinal adenopathy.  Skin:    General: Skin is  warm and dry.     Capillary Refill: Capillary refill takes less than 2 seconds.     Findings: No rash.  Neurological:     Mental Status: He is alert.  Psychiatric:        Mood and Affect: Mood normal.        Behavior: Behavior normal.       Results for orders placed or performed in visit on 05/09/23  POCT Urinalysis Dipstick (Automated)  Result Value Ref Range   Color, UA light yellow    Clarity, UA clear    Glucose, UA Negative Negative   Bilirubin, UA negative    Ketones, UA negative    Spec Grav, UA 1.015 1.010 - 1.025   Blood, UA negative    pH, UA 6.0 5.0 - 8.0   Protein, UA Negative Negative   Urobilinogen, UA 0.2 0.2 or 1.0 E.U./dL   Nitrite, UA negative    Leukocytes, UA Negative Negative   Lab Results  Component Value Date   CREATININE 0.89 10/13/2022   BUN 12 10/13/2022   NA 138 10/13/2022   K 4.5 10/13/2022   CL 101 10/13/2022   CO2 29 10/13/2022  No results found for: "WBC", "HGB", "HCT", "MCV", "PLT"  Lab Results  Component Value Date   PSA 0.97 10/13/2022   PSA 1.10 10/14/2021   PSA 0.70 09/12/2020   Assessment & Plan:   Problem List Items Addressed This Visit     Right groin pain - Primary    Overall reassuring MSK exam pointing against HNP or hip arthritis as cause.  Exam with small mass to mid right epididymis nontender to palpation, ?cyst. Check scrotal ultrasound given associated pain.  UA today normal.  Rec treat as possible epididymis inflammation with NSAID course, update with effect. If ongoing pain, low threshold to refer to urology.       Relevant Orders   POCT Urinalysis Dipstick (Automated) (Completed)   US Scrotum   US SCROTUM W/DOPPLER   Sciatic pain, right    Points to sciatic notch as site of pain but without radiation down leg. Provided with exercises for piriformis syndrome from [facility] pt advisor.  Update with effect.        No orders of the defined types were placed in this encounter.   Orders Placed This Encounter   Procedures   US Scrotum    Standing Status:   Future    Standing Expiration Date:   05/08/2024    Scheduling Instructions:     Not worried  about testicular torsion    Order Specific Question:   Reason for Exam (SYMPTOM  OR DIAGNOSIS REQUIRED)    Answer:   R epidiymal pain x 2-3 wks    Order Specific Question:   Preferred imaging location?    Answer:   [facility]   US SCROTUM W/DOPPLER    Standing Status:   Future    Standing Expiration Date:   05/08/2024    Order Specific Question:   Reason for Exam (SYMPTOM  OR DIAGNOSIS REQUIRED)    Answer:   R scrotal pain x3 wks, with epididymal mass on exam eval for cyst    Order Specific Question:   Preferred imaging location?    Answer:   [facility]   POCT Urinalysis Dipstick (Automated)    Patient Instructions  We will check scrotal ultrasound to evaluate for possible epididymal cyst.  In the meantime, take ibuprofen 400-600mg  2-3 times a day with meals for 7-10 days.  If this doesn't improve symptoms, let us know for urology referral.  Try exercises for possible piriformis syndrome as well.  Good to see you today.   Follow up plan: Return if symptoms worsen or fail to improve.  Eustaquio Boyden, MD

## 2023-05-09 NOTE — Telephone Encounter (Signed)
Patient called in to let Dr Reece Agar know that he have his [place] set up for [facility] [place] told him to call and let Dr Reece Agar known that the [place] would be with doppler so that he could send over the correct order

## 2023-05-09 NOTE — Addendum Note (Signed)
Addended by: Eustaquio Boyden on: 05/09/2023 04:57 PM   Modules accepted: Orders

## 2023-05-09 NOTE — Assessment & Plan Note (Addendum)
Overall reassuring MSK exam pointing against HNP or hip arthritis as cause.  Exam with small mass to mid right epididymis nontender to palpation, ?cyst. Check scrotal ultrasound given associated pain.  UA today normal.  Rec treat as possible epididymis inflammation with NSAID course, update with effect. If ongoing pain, low threshold to refer to urology.

## 2023-05-09 NOTE — Assessment & Plan Note (Addendum)
Points to sciatic notch as site of pain but without radiation down leg. Provided with exercises for piriformis syndrome from [facility] pt advisor.  Update with effect.

## 2023-05-09 NOTE — Patient Instructions (Addendum)
We will check scrotal ultrasound to evaluate for possible epididymal cyst.  In the meantime, take ibuprofen 400-600mg  2-3 times a day with meals for 7-10 days.  If this doesn't improve symptoms, let us know for urology referral.  Try exercises for possible piriformis syndrome as well.  Good to see you today.

## 2023-05-09 NOTE — Telephone Encounter (Signed)
Order changed.

## 2023-05-13 ENCOUNTER — Ambulatory Visit
Admission: RE | Admit: 2023-05-13 | Discharge: 2023-05-13 | Disposition: A | Discharge status: Home / Self Care | Payer: BC Managed Care – PPO | Source: Ambulatory Visit | Attending: Family Medicine | Admitting: Family Medicine

## 2023-05-13 DIAGNOSIS — R1031 Right lower quadrant pain: Secondary | ICD-10-CM | POA: Diagnosis present

## 2023-10-15 ENCOUNTER — Other Ambulatory Visit: Payer: Self-pay | Admitting: Family Medicine

## 2023-10-15 DIAGNOSIS — R7303 Prediabetes: Secondary | ICD-10-CM

## 2023-10-15 DIAGNOSIS — N401 Enlarged prostate with lower urinary tract symptoms: Secondary | ICD-10-CM

## 2023-10-15 DIAGNOSIS — E7800 Pure hypercholesterolemia, unspecified: Secondary | ICD-10-CM

## 2023-10-15 DIAGNOSIS — G4733 Obstructive sleep apnea (adult) (pediatric): Secondary | ICD-10-CM

## 2023-10-17 ENCOUNTER — Other Ambulatory Visit: Payer: BC Managed Care – PPO

## 2023-10-20 ENCOUNTER — Other Ambulatory Visit (INDEPENDENT_AMBULATORY_CARE_PROVIDER_SITE_OTHER): Payer: BC Managed Care – PPO

## 2023-10-20 DIAGNOSIS — N401 Enlarged prostate with lower urinary tract symptoms: Secondary | ICD-10-CM | POA: Diagnosis not present

## 2023-10-20 DIAGNOSIS — E7800 Pure hypercholesterolemia, unspecified: Secondary | ICD-10-CM

## 2023-10-20 DIAGNOSIS — G4733 Obstructive sleep apnea (adult) (pediatric): Secondary | ICD-10-CM

## 2023-10-20 DIAGNOSIS — R7303 Prediabetes: Secondary | ICD-10-CM

## 2023-10-20 DIAGNOSIS — R3914 Feeling of incomplete bladder emptying: Secondary | ICD-10-CM | POA: Diagnosis not present

## 2023-10-21 LAB — CBC WITH DIFFERENTIAL/PLATELET
Basophils Absolute: 0.1 10*3/uL (ref 0.0–0.1)
Basophils Relative: 0.8 % (ref 0.0–3.0)
Eosinophils Absolute: 0.2 10*3/uL (ref 0.0–0.7)
Eosinophils Relative: 3 % (ref 0.0–5.0)
HCT: 41.1 % (ref 39.0–52.0)
Hemoglobin: 13 g/dL (ref 13.0–17.0)
Lymphocytes Relative: 32.8 % (ref 12.0–46.0)
Lymphs Abs: 2.3 10*3/uL (ref 0.7–4.0)
MCHC: 31.7 g/dL (ref 30.0–36.0)
MCV: 77.9 fL — ABNORMAL LOW (ref 78.0–100.0)
Monocytes Absolute: 0.5 10*3/uL (ref 0.1–1.0)
Monocytes Relative: 6.8 % (ref 3.0–12.0)
Neutro Abs: 4 10*3/uL (ref 1.4–7.7)
Neutrophils Relative %: 56.6 % (ref 43.0–77.0)
Platelets: 225 10*3/uL (ref 150.0–400.0)
RBC: 5.27 Mil/uL (ref 4.22–5.81)
RDW: 18.4 % — ABNORMAL HIGH (ref 11.5–15.5)
WBC: 7.1 10*3/uL (ref 4.0–10.5)

## 2023-10-21 LAB — COMPREHENSIVE METABOLIC PANEL
ALT: 21 U/L (ref 0–53)
AST: 21 U/L (ref 0–37)
Albumin: 4.5 g/dL (ref 3.5–5.2)
Alkaline Phosphatase: 62 U/L (ref 39–117)
BUN: 12 mg/dL (ref 6–23)
CO2: 29 meq/L (ref 19–32)
Calcium: 10 mg/dL (ref 8.4–10.5)
Chloride: 101 meq/L (ref 96–112)
Creatinine, Ser: 0.94 mg/dL (ref 0.40–1.50)
GFR: 89.79 mL/min (ref >60.00)
Glucose, Bld: 87 mg/dL (ref 70–99)
Potassium: 4.3 meq/L (ref 3.5–5.1)
Sodium: 139 meq/L (ref 135–145)
Total Bilirubin: 1.7 mg/dL — ABNORMAL HIGH (ref 0.2–1.2)
Total Protein: 7.2 g/dL (ref 6.0–8.3)

## 2023-10-21 LAB — LIPID PANEL
Cholesterol: 155 mg/dL (ref 0–200)
HDL: 51.6 mg/dL (ref >39.00)
LDL Cholesterol: 81 mg/dL (ref 0–99)
NonHDL: 103.47
Total CHOL/HDL Ratio: 3
Triglycerides: 114 mg/dL (ref 0.0–149.0)
VLDL: 22.8 mg/dL (ref 0.0–40.0)

## 2023-10-21 LAB — PSA: PSA: 1.22 ng/mL (ref 0.10–4.00)

## 2023-10-21 LAB — HEMOGLOBIN A1C: Hgb A1c MFr Bld: 6.1 % (ref 4.6–6.5)

## 2023-10-21 LAB — TSH: TSH: 1.76 u[IU]/mL (ref 0.35–5.50)

## 2023-10-24 ENCOUNTER — Ambulatory Visit (INDEPENDENT_AMBULATORY_CARE_PROVIDER_SITE_OTHER): Payer: BC Managed Care – PPO | Admitting: Family Medicine

## 2023-10-24 ENCOUNTER — Encounter: Payer: Self-pay | Admitting: Family Medicine

## 2023-10-24 VITALS — BP 128/74 | HR 54 | Temp 98.3°F | Ht 66.75 in | Wt 217.5 lb

## 2023-10-24 DIAGNOSIS — R3914 Feeling of incomplete bladder emptying: Secondary | ICD-10-CM

## 2023-10-24 DIAGNOSIS — G4733 Obstructive sleep apnea (adult) (pediatric): Secondary | ICD-10-CM

## 2023-10-24 DIAGNOSIS — N401 Enlarged prostate with lower urinary tract symptoms: Secondary | ICD-10-CM

## 2023-10-24 DIAGNOSIS — E7800 Pure hypercholesterolemia, unspecified: Secondary | ICD-10-CM | POA: Diagnosis not present

## 2023-10-24 DIAGNOSIS — I10 Essential (primary) hypertension: Secondary | ICD-10-CM

## 2023-10-24 DIAGNOSIS — Z0000 Encounter for general adult medical examination without abnormal findings: Secondary | ICD-10-CM | POA: Diagnosis not present

## 2023-10-24 DIAGNOSIS — R718 Other abnormality of red blood cells: Secondary | ICD-10-CM

## 2023-10-24 DIAGNOSIS — D509 Iron deficiency anemia, unspecified: Secondary | ICD-10-CM | POA: Insufficient documentation

## 2023-10-24 DIAGNOSIS — R7303 Prediabetes: Secondary | ICD-10-CM

## 2023-10-24 DIAGNOSIS — E66811 Obesity, class 1: Secondary | ICD-10-CM

## 2023-10-24 MED ORDER — ROSUVASTATIN CALCIUM 20 MG PO TABS: 20.0000 mg | ORAL_TABLET | Freq: Every day | ORAL | 4 refills | Status: DC

## 2023-10-24 MED ORDER — TAMSULOSIN HCL 0.4 MG PO CAPS: 0.4000 mg | ORAL_CAPSULE | Freq: Every day | ORAL | 4 refills | Status: DC

## 2023-10-24 MED ORDER — LISINOPRIL 5 MG PO TABS: 5.0000 mg | ORAL_TABLET | Freq: Every day | ORAL | 4 refills | Status: DC

## 2023-10-24 NOTE — Assessment & Plan Note (Addendum)
Notes recurring symptoms.  ESS = 12.  Advised not to drive when sleepy.  Will order home sleep study through [facility] then determine pulm/neuro vs ENT eval.

## 2023-10-24 NOTE — Patient Instructions (Addendum)
Flu shot today.  We will order home sleep study ([facility])  Good to see you today.  Return as needed or in 1 year for next physical.

## 2023-10-24 NOTE — Assessment & Plan Note (Signed)
Continue to encourage healthy diet and lifestyle choices to affect sustainable weight loss.

## 2023-10-24 NOTE — Assessment & Plan Note (Signed)
Chronic, stable on minimal lisinopril dose.

## 2023-10-24 NOTE — Assessment & Plan Note (Signed)
Reviewed A1c, encouraged limiting added sugar in diet.

## 2023-10-24 NOTE — Assessment & Plan Note (Signed)
Chronic, stable on current regimen of Crestor 20mg  daily. The 10-year ASCVD risk score (Arnett DK, et al., 2019) is: 5.8%   Values used to calculate the score:     Age: 57 years     Sex: Male     Is Non-Hispanic African American: No     Diabetic: No     Tobacco smoker: No     Systolic Blood Pressure: 128 mmHg     Is BP treated: Yes     HDL Cholesterol: 51.6 mg/dL     Total Cholesterol: 155 mg/dL

## 2023-10-24 NOTE — Assessment & Plan Note (Signed)
Preventative protocols reviewed and updated unless pt declined. Discussed healthy diet and lifestyle.  

## 2023-10-24 NOTE — Assessment & Plan Note (Addendum)
Without anemia.  He notes poor red meat intake - will increase this and reassess at next labs.

## 2023-10-24 NOTE — Assessment & Plan Note (Signed)
Stable period on flomax.

## 2023-10-24 NOTE — Progress Notes (Signed)
Ph: [phone] Fax: [phone]   Patient ID: Johnny Daniel, male    DOB: Oct 25, 1966, 57 y.o.   MRN: [id number]  This visit was conducted in person.  BP 128/74   Pulse (!) 54   Temp 98.3 F (36.8 C) (Oral)   Ht 5' 6.75" (1.695 m)   Wt 217 lb 8 oz (98.7 kg)   SpO2 99%   BMI 34.32 kg/m    CC: CPE Subjective:   HPI: Johnny Daniel is a 57 y.o. male presenting on 10/24/2023 for Annual Exam   Mother had a stroke in February.  He and wife are planning to retire June 2025.   Ongoing chronic R knee pain.   Suspected OSA that previously improved with weight loss - previously saw pulm however he never completed home sleep test - notes symptoms may be returning with weight gain. + loud snorer, witnessed apnea (wife), intermittent non-restorative sleep, no morning HAs. Drives a lot - can get sleepy when driving.  Oral appliance was not helpful/tolerated.  Doesn't think would tolerate CPAP mask due to night time movements.  He would be interested in [place] implantable device.   Preventative: COLONOSCOPY 06/2017 - WNL, diverticulosis, rpt 10 yrs Leone Payor).  Prostate cancer screening - Yearly PSA screening. Nocturia x1 or less - continues flomax.  Lung cancer screening - not eligible  Flu shot yearly.  COVID vaccine Pfizer 11/2019, 01/2020, Pfizer booster 07/2020, Moderna bivalent 08/2021, Pfizer 09/2022.  Td 2010, Tdap 08/2019 Shingrix - 09/2020, 10/2022  Seat belt use discussed.  Sunscreen use discussed. No changing moles on skin.  Ex smoker - 2PY hx, quit 1989. 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 Interim medical history since our last visit reviewed. Allergies and medications reviewed and updated. Outpatient Medications Prior to Visit  Medication Sig Dispense Refill   GARLIC PO Take 1 capsule by mouth daily.     Turmeric 500 MG CAPS Take 1 capsule by mouth 2 (two) times daily.      lisinopril (ZESTRIL) 5 MG tablet Take 1 tablet (5 mg total) by mouth daily. 90 tablet 3   rosuvastatin (CRESTOR) 20 MG tablet Take 1 tablet (20 mg total) by mouth daily. 90 tablet 3   tamsulosin (FLOMAX) 0.4 MG CAPS capsule Take 1 capsule (0.4 mg total) by mouth daily after supper. 90 capsule 3   No facility-administered medications prior to visit.     Per HPI unless specifically indicated in ROS section below Review of Systems  Constitutional:  Negative for activity change, appetite change, chills, fatigue, fever and unexpected weight change.  HENT:  Negative for hearing loss.   Eyes:  Negative for visual disturbance.  Respiratory:  Negative for cough, chest tightness, shortness of breath and wheezing.   Cardiovascular:  Negative for chest pain, palpitations and leg swelling.  Gastrointestinal:  Negative for abdominal distention, abdominal pain, blood in stool, constipation, diarrhea, nausea and vomiting.  Genitourinary:  Negative for difficulty urinating and hematuria.  Musculoskeletal:  Negative for arthralgias, myalgias and neck pain.  Skin:  Negative for rash.  Neurological:  Negative for dizziness, seizures, syncope and headaches.  Hematological:  Negative for adenopathy. Does not bruise/bleed easily.  Psychiatric/Behavioral:  Negative for dysphoric mood. The patient is not nervous/anxious.     Objective:  BP 128/74   Pulse (!) 54   Temp 98.3 F (36.8 C) (Oral)   Ht 5' 6.75" (1.695 m)   Wt 217 lb 8 oz (98.7 kg)   SpO2 99%   BMI 34.32 kg/m   Wt Readings from Last 3 Encounters:  10/24/23 217 lb 8 oz (98.7 kg)  05/09/23 221 lb 4 oz (100.4 kg)   10/19/22 216 lb 9.6 oz (98.2 kg)      Physical Exam Vitals and nursing note reviewed.  Constitutional:      General: He is not in acute distress.    Appearance: Normal appearance. He is well-developed. He is not ill-appearing.  HENT:     Head: Normocephalic and atraumatic.     Right Ear: Hearing, tympanic membrane, ear canal and external ear normal.     Left Ear: Hearing, tympanic membrane, ear canal and external ear normal.     Nose: Nose normal.     Mouth/Throat:     Mouth: Mucous membranes are moist.     Pharynx: Oropharynx is clear. No oropharyngeal exudate or posterior oropharyngeal erythema.  Eyes:     General: No scleral icterus.    Extraocular Movements: Extraocular movements intact.     Conjunctiva/sclera: Conjunctivae normal.     Pupils: Pupils are equal, round, and reactive to light.  Neck:     Thyroid: No thyroid mass or thyromegaly.  Cardiovascular:     Rate and Rhythm: Normal rate and regular rhythm.     Pulses: Normal pulses.          Radial pulses are 2+ on the right side and 2+ on the left side.     Heart sounds: Normal heart sounds. No murmur heard. Pulmonary:     Effort: Pulmonary effort is normal. No respiratory distress.     Breath sounds: Normal breath sounds. No wheezing, rhonchi or rales.  Abdominal:     General: Bowel sounds are normal. There is no distension.     Palpations: Abdomen is soft. There is no mass.     Tenderness: There is no abdominal tenderness. There is no guarding or rebound.     Hernia: No hernia is present.  Musculoskeletal:        General: Normal range of motion.     Cervical back: Normal range of motion and neck supple.     Right lower leg: No edema.     Left lower leg: No edema.  Lymphadenopathy:     Cervical: No cervical adenopathy.  Skin:    General: Skin is warm and dry.     Findings: No rash.  Neurological:     General: No focal deficit present.     Mental Status: He is alert and oriented to person, place, and time.   Psychiatric:        Mood and Affect: Mood normal.        Behavior: Behavior normal.        Thought Content: Thought content normal.        Judgment: Judgment normal.       Results for orders placed or performed in visit on 10/20/23  CBC with Differential/Platelet  Result Value Ref Range   WBC 7.1 4.0 - 10.5 K/uL   RBC 5.27 4.22 - 5.81 Mil/uL   Hemoglobin 13.0  13.0 - 17.0 g/dL   HCT 16.1 09.6 - 04.5 %   MCV 77.9 (L) 78.0 - 100.0 fl   MCHC 31.7 30.0 - 36.0 g/dL   RDW 40.9 (H) 81.1 - 91.4 %   Platelets 225.0 150.0 - 400.0 K/uL   Neutrophils Relative % 56.6 43.0 - 77.0 %   Lymphocytes Relative 32.8 12.0 - 46.0 %   Monocytes Relative 6.8 3.0 - 12.0 %   Eosinophils Relative 3.0 0.0 - 5.0 %   Basophils Relative 0.8 0.0 - 3.0 %   Neutro Abs 4.0 1.4 - 7.7 K/uL   Lymphs Abs 2.3 0.7 - 4.0 K/uL   Monocytes Absolute 0.5 0.1 - 1.0 K/uL   Eosinophils Absolute 0.2 0.0 - 0.7 K/uL   Basophils Absolute 0.1 0.0 - 0.1 K/uL  TSH  Result Value Ref Range   TSH 1.76 0.35 - 5.50 uIU/mL  PSA  Result Value Ref Range   PSA 1.22 0.10 - 4.00 ng/mL  Hemoglobin A1c  Result Value Ref Range   Hgb A1c MFr Bld 6.1 4.6 - 6.5 %  Comprehensive metabolic panel  Result Value Ref Range   Sodium 139 135 - 145 mEq/L   Potassium 4.3 3.5 - 5.1 mEq/L   Chloride 101 96 - 112 mEq/L   CO2 29 19 - 32 mEq/L   Glucose, Bld 87 70 - 99 mg/dL   BUN 12 6 - 23 mg/dL   Creatinine, Ser 7.82 0.40 - 1.50 mg/dL   Total Bilirubin 1.7 (H) 0.2 - 1.2 mg/dL   Alkaline Phosphatase 62 39 - 117 U/L   AST 21 0 - 37 U/L   ALT 21 0 - 53 U/L   Total Protein 7.2 6.0 - 8.3 g/dL   Albumin 4.5 3.5 - 5.2 g/dL   GFR 95.62 >13.08 mL/min   Calcium 10.0 8.4 - 10.5 mg/dL  Lipid panel  Result Value Ref Range   Cholesterol 155 0 - 200 mg/dL   Triglycerides 657.8 0.0 - 149.0 mg/dL   HDL 46.96 >29.52 mg/dL   VLDL 84.1 0.0 - 32.4 mg/dL   LDL Cholesterol 81 0 - 99 mg/dL   Total CHOL/HDL Ratio 3    NonHDL 103.47     Assessment & Plan:    Problem List Items Addressed This Visit     Health maintenance examination - Primary (Chronic)    Preventative protocols reviewed and updated unless pt declined. Discussed healthy diet and lifestyle.       Obesity, Class I, BMI 30-34.9    Continue to encourage healthy diet and lifestyle choices to affect sustainable weight loss.       HLD (hyperlipidemia)    Chronic, stable on current regimen of Crestor 20mg  daily. The 10-year ASCVD risk score (Arnett DK, et al., 2019) is: 5.8%   Values used to calculate the score:     Age: 7 years     Sex: Male     Is Non-Hispanic African American: No     Diabetic: No     Tobacco smoker: No     Systolic Blood Pressure: 128 mmHg     Is BP treated: Yes     HDL Cholesterol: 51.6 mg/dL     Total Cholesterol: 155 mg/dL       Relevant Medications   lisinopril (ZESTRIL) 5 MG tablet   rosuvastatin (CRESTOR) 20 MG tablet   OSA (obstructive sleep apnea)    Notes recurring symptoms.  ESS = 12.  Advised not to drive when sleepy.  Will order home sleep study through [facility] then determine pulm/neuro vs ENT eval.      Prediabetes    Reviewed A1c, encouraged limiting added sugar in diet.       BPH (benign prostatic hyperplasia)    Stable period on flomax.       Relevant Medications   tamsulosin (FLOMAX) 0.4 MG CAPS capsule   Hypertension    Chronic, stable on minimal lisinopril dose.       Relevant Medications   lisinopril (ZESTRIL) 5 MG tablet   rosuvastatin (CRESTOR) 20 MG tablet   Microcytosis    Without anemia.  He notes poor red meat intake - will increase this and reassess at next labs.         Meds ordered this encounter  Medications   lisinopril (ZESTRIL) 5 MG tablet    Sig: Take 1 tablet (5 mg total) by mouth daily.    Dispense:  90 tablet    Refill:  4   rosuvastatin (CRESTOR) 20 MG tablet    Sig: Take 1 tablet (20 mg total) by mouth daily.    Dispense:  90 tablet    Refill:  4   tamsulosin (FLOMAX) 0.4  MG CAPS capsule    Sig: Take 1 capsule (0.4 mg total) by mouth daily after supper.    Dispense:  90 capsule    Refill:  4    No orders of the defined types were placed in this encounter.   Patient Instructions  Flu shot today.  We will order home sleep study ([facility])  Good to see you today.  Return as needed or in 1 year for next physical.   Follow up plan: Return in about 1 year (around 10/23/2024) for annual exam, prior fasting for blood work.  Eustaquio Boyden, MD

## 2023-12-03 ENCOUNTER — Encounter: Payer: Self-pay | Admitting: Family Medicine

## 2023-12-03 DIAGNOSIS — G4733 Obstructive sleep apnea (adult) (pediatric): Secondary | ICD-10-CM

## 2023-12-12 NOTE — Addendum Note (Signed)
 Addended by: Eustaquio Boyden on: 12/12/2023 07:39 AM   Modules accepted: Orders

## 2023-12-23 NOTE — Addendum Note (Signed)
Addended by: Eustaquio Boyden on: 12/23/2023 01:52 PM   Modules accepted: Orders

## 2024-01-05 ENCOUNTER — Encounter: Payer: Self-pay | Admitting: Internal Medicine

## 2024-01-05 ENCOUNTER — Ambulatory Visit: Payer: 59 | Admitting: Internal Medicine

## 2024-01-05 VITALS — BP 108/70 | HR 63 | Temp 97.6°F | Ht 67.0 in | Wt 226.0 lb

## 2024-01-05 DIAGNOSIS — G4733 Obstructive sleep apnea (adult) (pediatric): Secondary | ICD-10-CM | POA: Diagnosis not present

## 2024-01-05 NOTE — Progress Notes (Signed)
 Name: Johnny Daniel MRN: [id number] DOB: 1966-01-19    CHIEF COMPLAINT:  EXCESSIVE DAYTIME SLEEPINESS Assessment of OSA   HISTORY OF PRESENT ILLNESS: Patient is seen today for problems and issues with sleep related to excessive daytime sleepiness Patient  has been having sleep problems for many years Patient has been having excessive daytime sleepiness for a long time Patient has been having extreme fatigue and tiredness, lack of energy +  very Loud snoring every night + struggling breathe at night and gasps for air + morning headaches + Nonrefreshing sleep   Patient underwent sleep study on October 31, 2023 Findings suggest severe sleep apnea with AHI of 75  Discussed sleep data and reviewed with patient.  Encouraged proper weight management.  Discussed driving precautions and its relationship with hypersomnolence.  Discussed operating dangerous equipment and its relationship with hypersomnolence.  Discussed sleep hygiene, and benefits of a fixed sleep waked time.  The importance of getting eight or more hours of sleep discussed with patient.  Discussed limiting the use of the computer and television before bedtime.  Decrease naps during the day, so night time sleep will become enhanced.  Limit caffeine, and sleep deprivation.  HTN, stroke, and heart failure are potential risk factors.   Patient underwent sleep study on October 31, 2023 Findings suggest severe sleep apnea with AHI of 75  No evidence of heart failure at this time No evidence or signs of infection at this time No respiratory distress No fevers, chills, nausea, vomiting, diarrhea No evidence of lower extremity edema No evidence hemoptysis   PAST MEDICAL HISTORY :   has a past medical history of COVID-19 (04/19/2022), Headache, HLD (hyperlipidemia), and Obesity.  has a past surgical history that includes Knee cartilage surgery (Right, 1982); Knee arthroscopy with medial menisectomy (Left, 05/20/2015);  Trigger finger release (2017); and Colonoscopy (06/2017). Prior to Admission medications   Medication Sig Start Date End Date Taking? Authorizing Provider  GARLIC PO Take 1 capsule by mouth daily.   Yes [provider]  lisinopril  (ZESTRIL ) 5 MG tablet Take 1 tablet (5 mg total) by mouth daily. 10/24/23  Yes Rilla Baller, MD  rosuvastatin  (CRESTOR ) 20 MG tablet Take 1 tablet (20 mg total) by mouth daily. 10/24/23  Yes Rilla Baller, MD  tamsulosin  (FLOMAX ) 0.4 MG CAPS capsule Take 1 capsule (0.4 mg total) by mouth daily after supper. 10/24/23  Yes Rilla Baller, MD  Turmeric 500 MG CAPS Take 1 capsule by mouth 2 (two) times daily.    Yes [provider]   No Known Allergies  FAMILY HISTORY:  family history includes CAD (age of onset: 50) in his father and maternal grandfather; Cancer in his father and maternal grandmother; Liver cancer in his maternal grandmother; Stroke in his father. SOCIAL HISTORY:  reports that he quit smoking about 34 years ago. His smoking use included cigarettes. He started smoking about 36 years ago. He has a 2 pack-year smoking history. He has never used smokeless tobacco. He reports current alcohol use. He reports that he does not use drugs.   Review of Systems:  Gen:  Denies  fever, sweats, chills weight loss  HEENT: Denies blurred vision, double vision, ear pain, eye pain, hearing loss, nose bleeds, sore throat Cardiac:  No dizziness, chest pain or heaviness, chest tightness,edema, No JVD Resp:   No cough, -sputum production, -shortness of breath,-wheezing, -hemoptysis,  Gi: Denies swallowing difficulty, stomach pain, nausea or vomiting, diarrhea, constipation, bowel incontinence Gu:  Denies bladder incontinence, burning urine  Ext:   Denies Joint pain, stiffness or swelling Skin: Denies  skin rash, easy bruising or bleeding or hives Endoc:  Denies polyuria, polydipsia , polyphagia or weight change Psych:   Denies depression,  insomnia or hallucinations  Other:  All other systems negative   ALL OTHER ROS ARE NEGATIVE   BP 108/70 (BP Location: Left Arm, Patient Position: Sitting, Cuff Size: Normal)   Pulse 63   Temp 97.6 F (36.4 C) (Temporal)   Ht 5' 7 (1.702 m)   Wt 226 lb (102.5 kg)   SpO2 98%   BMI 35.40 kg/m     Physical Examination:   General Appearance: No distress  EYES PERRLA, EOM intact.   NECK Supple, No JVD ORAL CAVITY MALLAMPATI 4 Pulmonary: normal breath sounds, No wheezing.  CardiovascularNormal S1,S2.  No m/r/g.   Abdomen: Benign, Soft, non-tender. Skin:   warm, no rashes, no ecchymosis  Extremities: normal, no cyanosis, clubbing. Neuro:without focal findings,  speech normal  PSYCHIATRIC: Mood, affect within normal limits.   ALL OTHER ROS ARE NEGATIVE    ASSESSMENT AND PLAN SYNOPSIS  58 year old pleasant white male patient with severe obstructive sleep apnea  Assessment of OSA Severe OSA with AHI of 75 Start auto CPAP 4-14 DME referral for nasal pillows   Obesity -recommend significant weight loss -recommend changing diet  Deconditioned state -Recommend increased daily activity and exercise     CURRENT MEDICATIONS REVIEWED AT LENGTH WITH PATIENT TODAY   Patient  satisfied with Plan of action and management. All questions answered  Follow up 6 weeks   I spent a total of  62 minutes reviewing chart data, face-to-face evaluation with the patient, counseling and coordination of care as detailed above.    Nickolas Alm Cellar, M.D.  [facility]  Medical Director [facility] Medical Director [facility] Cardio-Pulmonary Department

## 2024-01-05 NOTE — Patient Instructions (Signed)
 START AUTO CPAP 4-14 cm h20 DME referral for Nasal Pillows  Avoid Allergens and Irritants Avoid secondhand smoke Avoid SICK contacts Recommend  Masking  when appropriate Recommend Keep up-to-date with vaccinations   Be aware of reduced alertness and do not drive or operate heavy machinery if experiencing this or drowsiness.  Exercise encouraged, as tolerated. Encouraged proper weight management.  Important to get eight or more hours of sleep  Limiting the use of the computer and television before bedtime.  Decrease naps during the day, so night time sleep will become enhanced.  Limit caffeine, and sleep deprivation.

## 2024-02-27 ENCOUNTER — Encounter: Payer: Self-pay | Admitting: Internal Medicine

## 2024-02-27 ENCOUNTER — Ambulatory Visit: Payer: 59 | Admitting: Internal Medicine

## 2024-02-27 VITALS — BP 120/80 | HR 63 | Temp 97.7°F | Ht 67.0 in | Wt 224.0 lb

## 2024-02-27 DIAGNOSIS — J309 Allergic rhinitis, unspecified: Secondary | ICD-10-CM

## 2024-02-27 DIAGNOSIS — G4733 Obstructive sleep apnea (adult) (pediatric): Secondary | ICD-10-CM | POA: Diagnosis not present

## 2024-02-27 NOTE — Patient Instructions (Signed)
 Obtain  NASAL CRADLE RESMED AIR-TOUCH FIT N30i MASK and call us if you need [place] to refer to DME company  Excellent Job A+ G[employer]!!  Continue CPAP as prescribed  Avoid Allergens and Irritants Avoid secondhand smoke Avoid SICK contacts Recommend  Masking  when appropriate Recommend Keep up-to-date with vaccinations   Be aware of reduced alertness and do not drive or operate heavy machinery if experiencing this or drowsiness.  Exercise encouraged, as tolerated. Encouraged proper weight management.  Important to get eight or more hours of sleep  Limiting the use of the computer and television before bedtime.  Decrease naps during the day, so night time sleep will become enhanced.  Limit caffeine, and sleep deprivation.

## 2024-02-27 NOTE — Progress Notes (Unsigned)
 Name: Johnny Daniel MRN: [id number] DOB: 1966/01/18    TESTS Patient underwent sleep study on October 31, 2023 Findings suggest severe sleep apnea with AHI of 75   CHIEF COMPLAINT Follow-up assessment for OSA  HISTORY OF PRESENT ILLNESS: CPAP download reviewed in detail Excellent compliance report 100% compliance Auto CPAP 4-14 AHI reduced to 2.3 Patient uses and benefits from therapy Using CPAP nightly and with naps Pressure setting is comfortable and is sleeping well. More energy less fatigue  Discussed sleep data and reviewed with patient.  Encouraged proper weight management.  Discussed driving precautions and its relationship with hypersomnolence.  Discussed operating dangerous equipment and its relationship with hypersomnolence.  Discussed sleep hygiene, and benefits of a fixed sleep waked time.  The importance of getting eight or more hours of sleep discussed with patient.  Discussed limiting the use of the computer and television before bedtime.  Decrease naps during the day, so night time sleep will become enhanced.  Limit caffeine, and sleep deprivation.  HTN, stroke, and heart failure are potential risk factors.    No evidence of heart failure at this time No evidence or signs of infection at this time No respiratory distress No fevers, chills, nausea, vomiting, diarrhea No evidence of lower extremity edema No evidence hemoptysis   PAST MEDICAL HISTORY :   has a past medical history of COVID-19 (04/19/2022), Headache, HLD (hyperlipidemia), and Obesity.  has a past surgical history that includes Knee cartilage surgery (Right, 1982); Knee arthroscopy with medial menisectomy (Left, 05/20/2015); Trigger finger release (2017); and Colonoscopy (06/2017). Prior to Admission medications   Medication Sig Start Date End Date Taking? Authorizing Provider  GARLIC PO Take 1 capsule by mouth daily.   Yes [provider]  lisinopril (ZESTRIL) 5 MG tablet Take 1  tablet (5 mg total) by mouth daily. 10/24/23  Yes Eustaquio Boyden, MD  rosuvastatin (CRESTOR) 20 MG tablet Take 1 tablet (20 mg total) by mouth daily. 10/24/23  Yes Eustaquio Boyden, MD  tamsulosin (FLOMAX) 0.4 MG CAPS capsule Take 1 capsule (0.4 mg total) by mouth daily after supper. 10/24/23  Yes Eustaquio Boyden, MD  Turmeric 500 MG CAPS Take 1 capsule by mouth 2 (two) times daily.    Yes [provider]   No Known Allergies  FAMILY HISTORY:  family history includes CAD (age of onset: 60) in his father and maternal grandfather; Cancer in his father and maternal grandmother; Liver cancer in his maternal grandmother; Stroke in his father. SOCIAL HISTORY:  reports that he quit smoking about 34 years ago. His smoking use included cigarettes. He started smoking about 36 years ago. He has a 2 pack-year smoking history. He has never used smokeless tobacco. He reports current alcohol use. He reports that he does not use drugs.   BP 120/80 (BP Location: Left Arm, Patient Position: Sitting, Cuff Size: Normal)   Pulse 63   Temp 97.7 F (36.5 C) (Temporal)   Ht 5\' 7"  (1.702 m)   Wt 224 lb (101.6 kg)   SpO2 98%   BMI 35.08 kg/m    Review of Systems: Gen:  Denies  fever, sweats, chills weight loss  HEENT: Denies blurred vision, double vision, ear pain, eye pain, hearing loss, nose bleeds, sore throat Cardiac:  No dizziness, chest pain or heaviness, chest tightness,edema, No JVD Resp:   No cough, -sputum production, -shortness of breath,-wheezing, -hemoptysis,  Other:  All other systems negative   Physical Examination:   General Appearance: No distress  EYES PERRLA,  EOM intact.   NECK Supple, No JVD Pulmonary: normal breath sounds, No wheezing.  CardiovascularNormal S1,S2.  No m/r/g.   Abdomen: Benign, Soft, non-tender. Neurology UE/LE 5/5 strength, no focal deficits Ext pulses intact, cap refill intact ALL OTHER ROS ARE NEGATIVE   ASSESSMENT AND  PLAN SYNOPSIS  58 year old pleasant white male patient with severe obstructive sleep apnea  Assessment of Sleep apnea Patient has excellent compliance report Discussed in detail with patient OSA is well-controlled with CPAP Continue current prescription Patient uses and benefits from therapy Using CPAP nightly.  pressure setting is comfortable and she is sleeping well. Continue auto CPAP 4-14 as prescribed Well-controlled OSA with AHI reduction to 2.3 Averaging 8 hours per night 100% compliance Patient will obtain his own nasal cradle mask  Patient Instructions Continue to use CPAP every night, minimum of 4-6 hours a night.  Change equipment every 30 days or as directed by DME.  Wash your tubing with warm soap and water daily, hang to dry. Wash humidifier portion weekly. Use bottled, distilled water and change daily   Be aware of reduced alertness and do not drive or operate heavy machinery if experiencing this or drowsiness.  Exercise encouraged, as tolerated. Encouraged proper weight management.  Important to get eight or more hours of sleep  Limiting the use of the computer and television before bedtime.  Decrease naps during the day, so night time sleep will become enhanced.  Limit caffeine, and sleep deprivation.  HTN, stroke, uncontrolled diabetes and heart failure are potential risk factors.  Risk of untreated sleep apnea including cardiac arrhthymias, stroke, DM, pulm HTN.  Try  NASAL CRADLE RESMED AIR-TOUCH FIT N30i MASK  Allergic rhinitis with cough Consider Zyrtec Allegra Claritin over-the-counter Call us if symptoms worse   Obesity -recommend significant weight loss -recommend changing diet  Deconditioned state -Recommend increased daily activity and exercise     CURRENT MEDICATIONS REVIEWED AT LENGTH WITH PATIENT TODAY   Patient  satisfied with Plan of action and management. All questions answered  Follow up in 6 months   I spent a total of  45  minutes reviewing chart data, face-to-face evaluation with the patient, counseling and coordination of care as detailed above.    Lucie Leather, M.D.  [facility]  Medical Director [facility] Medical Director [facility] Cardio-Pulmonary Department

## 2024-05-23 ENCOUNTER — Telehealth: Payer: Self-pay

## 2024-05-23 NOTE — Telephone Encounter (Signed)
 Patient reports he did receive CPAP machine and supplies. He is just wanting to know when his next shipment of mask and other supplies is due. Patient will call [facility]. Phone number given. NFN.

## 2024-05-23 NOTE — Telephone Encounter (Signed)
 Copied from CRM [id number]. Topic: Clinical - Order For Equipment >> May 23, 2024 10:28 AM Rilla B wrote: Reason for CRM:  Patient is in need of supplies and unsure of the process.  Stated Dr Isaiah was going to write and order however, he is unsure who the order would go to and the process.  Please call patient.   ----------------------------------------------------------------------- From previous Reason for Contact - Home Health Verbal Orders: Caller/Agency:  Callback Number:  Service Requested:   Frequency:  Any new concerns about the patient?     ----------------------------------------------------------------------- From previous Reason for Contact - Other: Reason for CRM:

## 2024-08-10 ENCOUNTER — Encounter: Payer: Self-pay | Admitting: Family Medicine

## 2024-08-10 DIAGNOSIS — Z23 Encounter for immunization: Secondary | ICD-10-CM

## 2024-08-10 MED ORDER — COVID-19 MRNA VAC-TRIS(PFIZER) 30 MCG/0.3ML IM SUSY: 0.3000 mL | PREFILLED_SYRINGE | Freq: Once | INTRAMUSCULAR | 0 refills | Status: AC

## 2024-08-10 NOTE — Telephone Encounter (Signed)
 ERx

## 2024-08-10 NOTE — Telephone Encounter (Signed)
Ok to send as pended.

## 2024-09-06 ENCOUNTER — Ambulatory Visit: Admitting: Internal Medicine

## 2024-09-06 ENCOUNTER — Encounter: Payer: Self-pay | Admitting: Internal Medicine

## 2024-09-06 VITALS — BP 110/70 | HR 56 | Temp 98.7°F | Ht 67.5 in | Wt 225.4 lb

## 2024-09-06 DIAGNOSIS — G4733 Obstructive sleep apnea (adult) (pediatric): Secondary | ICD-10-CM

## 2024-09-06 DIAGNOSIS — J309 Allergic rhinitis, unspecified: Secondary | ICD-10-CM

## 2024-09-06 NOTE — Progress Notes (Signed)
 Name: Demond Shallenberger MRN: [id number] DOB: March 27, 1966    TESTS Patient underwent sleep study on October 31, 2023 Findings suggest severe sleep apnea with AHI of 75   CHIEF COMPLAINT Follow up assessment of OSA   HISTORY OF PRESENT ILLNESS: CPAP download reviewed in detail Assessment of OSA Previous AHI 75 Continue CPAP as prescribed  Excellent compliance report Reviewed compliance report in detail with patient Patient definitely benefits the use of CPAP therapy as prescribed Using CPAP nightly and with naps Pressure setting is comfortable and is sleeping well. CPAP prescription 4-14 AHI reduced to NASAL CRADLE RESMED AIR-TOUCH FIT N30i MASK   No evidence of acute heart failure at this time No respiratory distress No fevers, chills, nausea, vomiting, diarrhea No evidence hemoptysis   PAST MEDICAL HISTORY :   has a past medical history of COVID-19 (04/19/2022), Headache, HLD (hyperlipidemia), and Obesity.  has a past surgical history that includes Knee cartilage surgery (Right, 1982); Knee arthroscopy with medial menisectomy (Left, 05/20/2015); Trigger finger release (2017); and Colonoscopy (06/2017). Prior to Admission medications   Medication Sig Start Date End Date Taking? Authorizing Provider  GARLIC PO Take 1 capsule by mouth daily.   Yes [provider]  lisinopril  (ZESTRIL ) 5 MG tablet Take 1 tablet (5 mg total) by mouth daily. 10/24/23  Yes Rilla Baller, MD  rosuvastatin  (CRESTOR ) 20 MG tablet Take 1 tablet (20 mg total) by mouth daily. 10/24/23  Yes Rilla Baller, MD  tamsulosin  (FLOMAX ) 0.4 MG CAPS capsule Take 1 capsule (0.4 mg total) by mouth daily after supper. 10/24/23  Yes Rilla Baller, MD  Turmeric 500 MG CAPS Take 1 capsule by mouth 2 (two) times daily.    Yes [provider]   No Known Allergies  FAMILY HISTORY:  family history includes CAD (age of onset: 48) in his father and maternal grandfather; Cancer in his father and  maternal grandmother; Liver cancer in his maternal grandmother; Stroke in his father. SOCIAL HISTORY:  reports that he quit smoking about 34 years ago. His smoking use included cigarettes. He started smoking about 36 years ago. He has a 2 pack-year smoking history. He has never used smokeless tobacco. He reports current alcohol use. He reports that he does not use drugs.   There were no vitals taken for this visit.       Review of Systems: Gen:  Denies  fever, sweats, chills weight loss  HEENT: Denies blurred vision, double vision, ear pain, eye pain, hearing loss, nose bleeds, sore throat Cardiac:  No dizziness, chest pain or heaviness, chest tightness,edema, No JVD Resp:   No cough, -sputum production, -shortness of breath,-wheezing, -hemoptysis,  Other:  All other systems negative   Physical Examination:   General Appearance: No distress  EYES PERRLA, EOM intact.   NECK Supple, No JVD Pulmonary: normal breath sounds, No wheezing.  CardiovascularNormal S1,S2.  No m/r/g.   Abdomen: Benign, Soft, non-tender. Neurology UE/LE 5/5 strength, no focal deficits Ext pulses intact, cap refill intact ALL OTHER ROS ARE NEGATIVE   ASSESSMENT AND PLAN SYNOPSIS  58 year old pleasant white male patient with severe obstructive sleep apnea   Assessment of OSA Previous AHI 75 Continue CPAP as prescribed  Excellent compliance report Reviewed compliance report in detail with patient Patient definitely benefits the use of CPAP therapy as prescribed Using CPAP nightly and with naps Pressure setting is comfortable and is sleeping well. CPAP prescription 4-14 AHI reduced to   No evidence of acute heart failure at this time No  respiratory distress No fevers, chills, nausea, vomiting, diarrhea No evidence hemoptysis  Patient Instructions Continue to use CPAP every night, minimum of 4-6 hours a night.  Change equipment every 30 days or as directed by DME.  Wash your tubing with warm  soap and water daily, hang to dry. Wash humidifier portion weekly. Use bottled, distilled water and change daily   Be aware of reduced alertness and do not drive or operate heavy machinery if experiencing this or drowsiness.  Exercise encouraged, as tolerated. Encouraged proper weight management.  Important to get eight or more hours of sleep  Limiting the use of the computer and television before bedtime.  Decrease naps during the day, so night time sleep will become enhanced.  Limit caffeine, and sleep deprivation.  HTN, stroke, uncontrolled diabetes and heart failure are potential risk factors.  Risk of untreated sleep apnea including cardiac arrhthymias, stroke, DM, pulm HTN.   Allergic rhinitis with cough Consider Zyrtec Allegra Claritin over-the-counter Call us  if symptoms worse  Obesity -recommend significant weight loss -recommend changing diet  Deconditioned state -Recommend increased daily activity and exercise    MEDICATION ADJUSTMENTS/LABS AND TESTS ORDERED: Continue CPAP as prescribed Recommend weight loss Avoid Allergens and Irritants Avoid secondhand smoke Avoid SICK contacts Recommend  Masking  when appropriate Recommend Keep up-to-date with vaccinations Plan to try nasal cradle AirFit N30i mask   CURRENT MEDICATIONS REVIEWED AT LENGTH WITH PATIENT TODAY   Patient  satisfied with Plan of action and management. All questions answered   Follow up 1 year   I spent a total of 41 minutes dedicated to the care of this patient on the date of this encounter to include pre-visit review of records, face-to-face time with the patient discussing conditions above, post visit ordering of testing, clinical documentation with the electronic health record, making appropriate referrals as documented, and communicating necessary information to the patient's healthcare team.    The Patient requires high complexity decision making for assessment and support, frequent  evaluation and titration of therapies, application of advanced monitoring technologies and extensive interpretation of multiple databases.  Patient satisfied with Plan of action and management. All questions answered    Nickolas Alm Cellar, M.D.  [facility]  Medical Director [facility] [place]

## 2024-09-06 NOTE — Patient Instructions (Signed)

## 2024-10-13 ENCOUNTER — Other Ambulatory Visit: Payer: Self-pay | Admitting: Family Medicine

## 2024-10-13 DIAGNOSIS — E7800 Pure hypercholesterolemia, unspecified: Secondary | ICD-10-CM

## 2024-10-13 DIAGNOSIS — R718 Other abnormality of red blood cells: Secondary | ICD-10-CM

## 2024-10-13 DIAGNOSIS — R7303 Prediabetes: Secondary | ICD-10-CM

## 2024-10-13 DIAGNOSIS — N401 Enlarged prostate with lower urinary tract symptoms: Secondary | ICD-10-CM

## 2024-10-16 ENCOUNTER — Other Ambulatory Visit (INDEPENDENT_AMBULATORY_CARE_PROVIDER_SITE_OTHER): Payer: BC Managed Care – PPO

## 2024-10-16 ENCOUNTER — Encounter: Payer: Self-pay | Admitting: Family Medicine

## 2024-10-16 DIAGNOSIS — R718 Other abnormality of red blood cells: Secondary | ICD-10-CM

## 2024-10-16 DIAGNOSIS — R3914 Feeling of incomplete bladder emptying: Secondary | ICD-10-CM

## 2024-10-16 DIAGNOSIS — N401 Enlarged prostate with lower urinary tract symptoms: Secondary | ICD-10-CM

## 2024-10-16 DIAGNOSIS — R7303 Prediabetes: Secondary | ICD-10-CM | POA: Diagnosis not present

## 2024-10-16 DIAGNOSIS — E7800 Pure hypercholesterolemia, unspecified: Secondary | ICD-10-CM

## 2024-10-16 LAB — CBC WITH DIFFERENTIAL/PLATELET
Basophils Absolute: 0 K/uL (ref 0.0–0.1)
Basophils Relative: 0.5 % (ref 0.0–3.0)
Eosinophils Absolute: 0.2 K/uL (ref 0.0–0.7)
Eosinophils Relative: 3.7 % (ref 0.0–5.0)
HCT: 36.5 % — ABNORMAL LOW (ref 39.0–52.0)
Hemoglobin: 11.4 g/dL — ABNORMAL LOW (ref 13.0–17.0)
Lymphocytes Relative: 32.6 % (ref 12.0–46.0)
Lymphs Abs: 1.9 K/uL (ref 0.7–4.0)
MCHC: 31.2 g/dL (ref 30.0–36.0)
MCV: 71.6 fl — ABNORMAL LOW (ref 78.0–100.0)
Monocytes Absolute: 0.4 K/uL (ref 0.1–1.0)
Monocytes Relative: 7.1 % (ref 3.0–12.0)
Neutro Abs: 3.2 K/uL (ref 1.4–7.7)
Neutrophils Relative %: 56.1 % (ref 43.0–77.0)
Platelets: 227 K/uL (ref 150.0–400.0)
RBC: 5.1 Mil/uL (ref 4.22–5.81)
RDW: 18.9 % — ABNORMAL HIGH (ref 11.5–15.5)
WBC: 5.7 K/uL (ref 4.0–10.5)

## 2024-10-16 LAB — LIPID PANEL
Cholesterol: 152 mg/dL (ref 0–200)
HDL: 53 mg/dL (ref >39.00)
LDL Cholesterol: 75 mg/dL (ref 0–99)
NonHDL: 98.56
Total CHOL/HDL Ratio: 3
Triglycerides: 118 mg/dL (ref 0.0–149.0)
VLDL: 23.6 mg/dL (ref 0.0–40.0)

## 2024-10-16 LAB — COMPREHENSIVE METABOLIC PANEL WITH GFR
ALT: 19 U/L (ref 0–53)
AST: 24 U/L (ref 0–37)
Albumin: 4.2 g/dL (ref 3.5–5.2)
Alkaline Phosphatase: 61 U/L (ref 39–117)
BUN: 14 mg/dL (ref 6–23)
CO2: 28 meq/L (ref 19–32)
Calcium: 9.7 mg/dL (ref 8.4–10.5)
Chloride: 103 meq/L (ref 96–112)
Creatinine, Ser: 0.87 mg/dL (ref 0.40–1.50)
GFR: 94.91 mL/min (ref >60.00)
Glucose, Bld: 109 mg/dL — ABNORMAL HIGH (ref 70–99)
Potassium: 4.4 meq/L (ref 3.5–5.1)
Sodium: 138 meq/L (ref 135–145)
Total Bilirubin: 1.2 mg/dL (ref 0.2–1.2)
Total Protein: 7.1 g/dL (ref 6.0–8.3)

## 2024-10-16 LAB — IBC PANEL
Iron: 25 ug/dL — ABNORMAL LOW (ref 42–165)
Saturation Ratios: 4.4 % — ABNORMAL LOW (ref 20.0–50.0)
TIBC: 565.6 ug/dL — ABNORMAL HIGH (ref 250.0–450.0)
Transferrin: 404 mg/dL — ABNORMAL HIGH (ref 212.0–360.0)

## 2024-10-16 LAB — HEMOGLOBIN A1C: Hgb A1c MFr Bld: 6 % (ref 4.6–6.5)

## 2024-10-16 LAB — FERRITIN: Ferritin: 4.2 ng/mL — ABNORMAL LOW (ref 22.0–322.0)

## 2024-10-16 LAB — PSA: PSA: 1.52 ng/mL (ref 0.10–4.00)

## 2024-10-17 ENCOUNTER — Ambulatory Visit: Payer: Self-pay | Admitting: Family Medicine

## 2024-10-23 ENCOUNTER — Encounter: Payer: Self-pay | Admitting: Family Medicine

## 2024-10-23 ENCOUNTER — Ambulatory Visit (INDEPENDENT_AMBULATORY_CARE_PROVIDER_SITE_OTHER): Payer: BC Managed Care – PPO | Admitting: Family Medicine

## 2024-10-23 ENCOUNTER — Ambulatory Visit: Payer: Self-pay | Admitting: Family Medicine

## 2024-10-23 VITALS — BP 130/64 | HR 56 | Temp 98.4°F | Ht 68.0 in | Wt 224.0 lb

## 2024-10-23 DIAGNOSIS — N401 Enlarged prostate with lower urinary tract symptoms: Secondary | ICD-10-CM | POA: Diagnosis not present

## 2024-10-23 DIAGNOSIS — I10 Essential (primary) hypertension: Secondary | ICD-10-CM | POA: Diagnosis not present

## 2024-10-23 DIAGNOSIS — G4733 Obstructive sleep apnea (adult) (pediatric): Secondary | ICD-10-CM

## 2024-10-23 DIAGNOSIS — D508 Other iron deficiency anemias: Secondary | ICD-10-CM | POA: Diagnosis not present

## 2024-10-23 DIAGNOSIS — R3914 Feeling of incomplete bladder emptying: Secondary | ICD-10-CM

## 2024-10-23 DIAGNOSIS — Z23 Encounter for immunization: Secondary | ICD-10-CM

## 2024-10-23 DIAGNOSIS — R7303 Prediabetes: Secondary | ICD-10-CM

## 2024-10-23 DIAGNOSIS — E66811 Obesity, class 1: Secondary | ICD-10-CM

## 2024-10-23 DIAGNOSIS — E7800 Pure hypercholesterolemia, unspecified: Secondary | ICD-10-CM | POA: Diagnosis not present

## 2024-10-23 DIAGNOSIS — Z0000 Encounter for general adult medical examination without abnormal findings: Secondary | ICD-10-CM | POA: Diagnosis not present

## 2024-10-23 LAB — POC URINALSYSI DIPSTICK (AUTOMATED)
Bilirubin, UA: NEGATIVE
Blood, UA: NEGATIVE
Glucose, UA: NEGATIVE
Ketones, UA: NEGATIVE
Leukocytes, UA: NEGATIVE
Nitrite, UA: NEGATIVE
Protein, UA: NEGATIVE
Spec Grav, UA: 1.01 (ref 1.010–1.025)
Urobilinogen, UA: 0.2 U/dL
pH, UA: 6 (ref 5.0–8.0)

## 2024-10-23 MED ORDER — ROSUVASTATIN CALCIUM 20 MG PO TABS: 20.0000 mg | ORAL_TABLET | Freq: Every day | ORAL | 3 refills | Status: AC

## 2024-10-23 MED ORDER — IRON (FERROUS SULFATE) 325 (65 FE) MG PO TABS: 325.0000 mg | ORAL_TABLET | Freq: Every day | ORAL | Status: AC

## 2024-10-23 MED ORDER — LISINOPRIL 5 MG PO TABS: 5.0000 mg | ORAL_TABLET | Freq: Every day | ORAL | 3 refills | Status: DC

## 2024-10-23 MED ORDER — TAMSULOSIN HCL 0.4 MG PO CAPS: 0.4000 mg | ORAL_CAPSULE | Freq: Every day | ORAL | 3 refills | Status: AC

## 2024-10-23 NOTE — Assessment & Plan Note (Signed)
 Preventative protocols reviewed and updated unless pt declined. Discussed healthy diet and lifestyle.

## 2024-10-23 NOTE — Patient Instructions (Addendum)
 Prevnar-20 today  Urinalysis today  Stool kit today.  Stop donating blood at this time. Start oral iron  ferrous sulfate  325mg  (65FE) daily to MWF. Schedule lab visit in 2-3 months to recheck levels.  Return as needed or in 1 year for next physical.

## 2024-10-23 NOTE — Assessment & Plan Note (Signed)
Encouraged limiting added sugars in diet.  

## 2024-10-23 NOTE — Assessment & Plan Note (Signed)
 Continue flomax

## 2024-10-23 NOTE — Assessment & Plan Note (Signed)
 Appreciate pulm care now on CPAP

## 2024-10-23 NOTE — Assessment & Plan Note (Signed)
 Chronic, stable on rosuvastatin  20mg  daily - contiue. The 10-year ASCVD risk score (Arnett DK, et al., 2019) is: 6.3%   Values used to calculate the score:     Age: 58 years     Clincally relevant sex: Male     Is Non-Hispanic African American: No     Diabetic: No     Tobacco smoker: No     Systolic Blood Pressure: 130 mmHg     Is BP treated: Yes     HDL Cholesterol: 53 mg/dL     Total Cholesterol: 152 mg/dL

## 2024-10-23 NOTE — Assessment & Plan Note (Signed)
 Newly noted - he regularly donates blood q2 mo Denies blood in stool or urine.  Check UA, iFOB  Rec stop donating blood Rec start ferrous sulfate  daily Recheck levels in 2 months.

## 2024-10-23 NOTE — Assessment & Plan Note (Signed)
 Reviewed healthy diet and lifestyle changes to effect sustainable weight loss.

## 2024-10-23 NOTE — Progress Notes (Signed)
 Ph: [phone] Fax: [phone]   Patient ID: Johnny Daniel, male    DOB: 07-14-1966, 58 y.o.   MRN: [id number]  This visit was conducted in person.  BP 130/64   Pulse (!) 56   Temp 98.4 F (36.9 C) (Oral)   Ht 5' 8 (1.727 m)   Wt 224 lb (101.6 kg)   SpO2 98%   BMI 34.06 kg/m    CC: CPE Subjective:   HPI: Johnny Daniel is a 58 y.o. male presenting on 10/23/2024 for Annual Exam   Mother had a stroke in February.  He and wife retired July 1st 2025.   Newly noted IDA - he has been regularly donating blood q56 days for the past 2 years.  Denies blood in stool or urine.  No fatigue, dizziness, chest pain or dyspnea.  No RLS symptoms.  Eats red meat once a week, mostly chicken and tofu, leafy green vegetables daily, grains/beans several times a week. He's working out 5d/wk, 80 min cardio (running and stair stepping) as well as upper body strength training.  Running limited by chronic knee osteoarthritis - wants to defer knee replacement at this time.   Severe OSA with AHI 75 now seeing pulmonology Dr Isaiah on CPAP 4-14 pressure setting with decreased events down to practically zero.   R ear stays itchy    Preventative: COLONOSCOPY 06/2017 - WNL, diverticulosis, rpt 10 yrs Ollen).  Prostate cancer screening - Yearly PSA screening. Nocturia x1 or less - continues flomax .  Lung cancer screening - not eligible  Flu shot yearly.  COVID vaccine Pfizer 11/2019, 01/2020, Pfizer booster 07/2020, Moderna bivalent 08/2021, Pfizer 09/2022, 07/2024.  Prevnar-20 today  Td 2010, Tdap 08/2019 Shingrix  - 09/2020, 10/2022  Seat belt use discussed.  Sunscreen use discussed. No changing moles on skin.  Ex smoker - 2PY hx, quit 1989. 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 Interim medical history since our last visit reviewed. Allergies and medications reviewed and updated. Outpatient Medications Prior to Visit  Medication Sig Dispense Refill   GARLIC PO Take 1 capsule by mouth daily.     Turmeric 500 MG CAPS Take 1 capsule by mouth 2 (two) times daily.      lisinopril  (ZESTRIL ) 5 MG tablet Take 1 tablet (5 mg total) by mouth daily. 90 tablet 4   rosuvastatin  (CRESTOR ) 20 MG tablet Take 1 tablet (20 mg total) by mouth daily. 90 tablet 4   tamsulosin  (FLOMAX ) 0.4 MG CAPS capsule Take 1 capsule (0.4 mg total) by mouth daily after supper. 90 capsule 4   No facility-administered medications prior to visit.     Per HPI unless specifically indicated in ROS section below Review of Systems  Constitutional:  Negative for activity change, appetite change, chills, fatigue, fever and unexpected weight change.  HENT:  Negative for hearing loss.   Eyes:  Negative for visual disturbance.  Respiratory:  Negative for cough, chest tightness, shortness of breath and wheezing.   Cardiovascular:  Negative for chest  pain, palpitations and leg swelling.  Gastrointestinal:  Negative for abdominal distention, abdominal pain, blood in stool, constipation, diarrhea, nausea and vomiting.  Genitourinary:  Negative for difficulty urinating and hematuria.  Musculoskeletal:  Negative for arthralgias, myalgias and neck pain.  Skin:  Negative for rash.  Neurological:  Negative for dizziness, seizures, syncope and headaches.  Hematological:  Negative for adenopathy. Does not bruise/bleed easily.  Psychiatric/Behavioral:  Negative for dysphoric mood. The patient is not nervous/anxious.     Objective:  BP 130/64   Pulse (!) 56    Temp 98.4 F (36.9 C) (Oral)   Ht 5' 8 (1.727 m)   Wt 224 lb (101.6 kg)   SpO2 98%   BMI 34.06 kg/m   Wt Readings from Last 3 Encounters:  10/23/24 224 lb (101.6 kg)  09/06/24 225 lb 6.4 oz (102.2 kg)  02/27/24 224 lb (101.6 kg)      Physical Exam Vitals and nursing note reviewed.  Constitutional:      General: He is not in acute distress.    Appearance: Normal appearance. He is well-developed. He is not ill-appearing.  HENT:     Head: Normocephalic and atraumatic.     Right Ear: Hearing, tympanic membrane, ear canal and external ear normal.     Left Ear: Hearing, tympanic membrane, ear canal and external ear normal.     Mouth/Throat:     Mouth: Mucous membranes are moist.     Pharynx: Oropharynx is clear. No oropharyngeal exudate or posterior oropharyngeal erythema.  Eyes:     General: No scleral icterus.    Extraocular Movements: Extraocular movements intact.     Conjunctiva/sclera: Conjunctivae normal.     Pupils: Pupils are equal, round, and reactive to light.  Neck:     Thyroid : No thyroid  mass or thyromegaly.  Cardiovascular:     Rate and Rhythm: Normal rate and regular rhythm.     Pulses: Normal pulses.          Radial pulses are 2+ on the right side and 2+ on the left side.     Heart sounds: Normal heart sounds. No murmur heard. Pulmonary:     Effort: Pulmonary effort is normal. No respiratory distress.     Breath sounds: Normal breath sounds. No wheezing, rhonchi or rales.  Abdominal:     General: Bowel sounds are normal. There is no distension.     Palpations: Abdomen is soft. There is no mass.     Tenderness: There is no abdominal tenderness. There is no guarding or rebound.     Hernia: No hernia is present.  Musculoskeletal:        General: Normal range of motion.     Cervical back: Normal range of motion and neck supple.     Right lower leg: No edema.     Left lower leg: No edema.  Lymphadenopathy:     Cervical: No cervical adenopathy.  Skin:     General: Skin is warm and dry.     Findings: No rash.  Neurological:     General: No focal deficit present.     Mental Status: He is alert and oriented to person, place, and time.  Psychiatric:        Mood and Affect: Mood normal.        Behavior: Behavior normal.        Thought Content: Thought content normal.        Judgment: Judgment normal.       Results for  orders placed or performed in visit on 10/23/24  POCT Urinalysis Dipstick (Automated)   Collection Time: 10/23/24 10:34 AM  Result Value Ref Range   Color, UA [place]    Clarity, UA clear    Glucose, UA Negative Negative   Bilirubin, UA Negative    Ketones, UA Negative    Spec Grav, UA 1.010 1.010 - 1.025   Blood, UA Negative    pH, UA 6.0 5.0 - 8.0   Protein, UA Negative Negative   Urobilinogen, UA 0.2 0.2 or 1.0 E.U./dL   Nitrite, UA Negative    Leukocytes, UA Negative Negative    Assessment & Plan:   Problem List Items Addressed This Visit     Health maintenance examination - Primary (Chronic)   Preventative protocols reviewed and updated unless pt declined. Discussed healthy diet and lifestyle.       Obesity, Class I, BMI 30-34.9   Reviewed healthy diet and lifestyle changes to effect sustainable weight loss.        HLD (hyperlipidemia)   Chronic, stable on rosuvastatin  20mg  daily - contiue. The 10-year ASCVD risk score (Arnett DK, et al., 2019) is: 6.3%   Values used to calculate the score:     Age: 49 years     Clincally relevant sex: Male     Is Non-Hispanic African American: No     Diabetic: No     Tobacco smoker: No     Systolic Blood Pressure: 130 mmHg     Is BP treated: Yes     HDL Cholesterol: 53 mg/dL     Total Cholesterol: 152 mg/dL       Relevant Medications   lisinopril  (ZESTRIL ) 5 MG tablet   rosuvastatin  (CRESTOR ) 20 MG tablet   Severe obstructive sleep apnea   Appreciate pulm care now on CPAP      Prediabetes   Encouraged limiting added sugars in diet.       BPH (benign  prostatic hyperplasia)   Continue flomax .       Relevant Medications   tamsulosin  (FLOMAX ) 0.4 MG CAPS capsule   Hypertension   Chronic, stable. Continue low dose lisinopril .       Relevant Medications   lisinopril  (ZESTRIL ) 5 MG tablet   rosuvastatin  (CRESTOR ) 20 MG tablet   IDA (iron  deficiency anemia)   Newly noted - he regularly donates blood q2 mo Denies blood in stool or urine.  Check UA, iFOB  Rec stop donating blood Rec start ferrous sulfate  daily Recheck levels in 2 months.       Relevant Medications   Iron , Ferrous Sulfate , 325 (65 Fe) MG TABS   Other Relevant Orders   Fecal occult blood, imunochemical   CBC with Differential/Platelet   IBC panel   Ferritin   POCT Urinalysis Dipstick (Automated) (Completed)   Other Visit Diagnoses       Need for vaccination against Streptococcus pneumoniae       Relevant Orders   Pneumococcal conjugate vaccine 20-valent (Prevnar 20) (Completed)        Meds ordered this encounter  Medications   lisinopril  (ZESTRIL ) 5 MG tablet    Sig: Take 1 tablet (5 mg total) by mouth daily.    Dispense:  90 tablet    Refill:  3   rosuvastatin  (CRESTOR ) 20 MG tablet    Sig: Take 1 tablet (20 mg total) by mouth daily.    Dispense:  90 tablet    Refill:  3   tamsulosin  (FLOMAX ) 0.4 MG  CAPS capsule    Sig: Take 1 capsule (0.4 mg total) by mouth daily after supper.    Dispense:  90 capsule    Refill:  3   Iron , Ferrous Sulfate , 325 (65 Fe) MG TABS    Sig: Take 325 mg by mouth daily.    Orders Placed This Encounter  Procedures   Fecal occult blood, imunochemical    Standing Status:   Future    Expiration Date:   10/23/2025   Pneumococcal conjugate vaccine 20-valent (Prevnar 20)   CBC with Differential/Platelet    Standing Status:   Future    Expiration Date:   10/23/2025   IBC panel    Standing Status:   Future    Expiration Date:   10/23/2025   Ferritin    Standing Status:   Future    Expiration Date:   10/23/2025   POCT  Urinalysis Dipstick (Automated)    Patient Instructions  Prevnar-20 today  Urinalysis today  Stool kit today.  Stop donating blood at this time. Start oral iron  ferrous sulfate  325mg  (65FE) daily to MWF. Schedule lab visit in 2-3 months to recheck levels.  Return as needed or in 1 year for next physical.   Follow up plan: Return in about 1 year (around 10/23/2025) for annual exam, prior fasting for blood work.  Anton Blas, MD

## 2024-10-23 NOTE — Assessment & Plan Note (Addendum)
Chronic, stable. Continue low dose lisinopril  

## 2024-10-24 ENCOUNTER — Other Ambulatory Visit: Payer: Self-pay | Admitting: Radiology

## 2024-10-24 DIAGNOSIS — D508 Other iron deficiency anemias: Secondary | ICD-10-CM

## 2024-10-24 LAB — FECAL OCCULT BLOOD, IMMUNOCHEMICAL: Fecal Occult Bld: NEGATIVE

## 2024-10-24 NOTE — Telephone Encounter (Signed)
 Discussed at OV this week.

## 2024-11-30 ENCOUNTER — Other Ambulatory Visit: Payer: Self-pay | Admitting: Family Medicine

## 2024-11-30 DIAGNOSIS — I10 Essential (primary) hypertension: Secondary | ICD-10-CM

## 2025-01-23 ENCOUNTER — Other Ambulatory Visit

## 2025-01-23 ENCOUNTER — Ambulatory Visit: Admitting: Family Medicine

## 2025-10-30 ENCOUNTER — Other Ambulatory Visit

## 2025-11-06 ENCOUNTER — Encounter: Admitting: Family Medicine

## 2025-11-13 ENCOUNTER — Encounter: Admitting: Family Medicine
